# Patient Record
Sex: Male | Born: 1970 | Race: White | Hispanic: No | Marital: Married | Smoking: Former smoker
Health system: Southern US, Community
[De-identification: ages and names within clinical notes are randomized; demographics above are authoritative.]

## PROBLEM LIST (undated history)

## (undated) DIAGNOSIS — K648 Other hemorrhoids: Secondary | ICD-10-CM

## (undated) DIAGNOSIS — K602 Anal fissure, unspecified: Secondary | ICD-10-CM

## (undated) HISTORY — PX: TONSILLECTOMY: SUR1361

## (undated) HISTORY — DX: Anal fissure, unspecified: K60.2

## (undated) HISTORY — DX: Other hemorrhoids: K64.8

---

## 2005-01-15 ENCOUNTER — Encounter: Payer: Self-pay | Admitting: Internal Medicine

## 2005-01-19 ENCOUNTER — Ambulatory Visit: Payer: Self-pay | Admitting: Internal Medicine

## 2005-01-25 ENCOUNTER — Encounter (INDEPENDENT_AMBULATORY_CARE_PROVIDER_SITE_OTHER): Payer: Self-pay | Admitting: *Deleted

## 2005-01-25 ENCOUNTER — Ambulatory Visit: Payer: Self-pay | Admitting: Internal Medicine

## 2005-09-11 ENCOUNTER — Ambulatory Visit: Payer: Self-pay | Admitting: Internal Medicine

## 2005-09-14 ENCOUNTER — Encounter (INDEPENDENT_AMBULATORY_CARE_PROVIDER_SITE_OTHER): Payer: Self-pay | Admitting: *Deleted

## 2005-09-14 ENCOUNTER — Ambulatory Visit: Payer: Self-pay | Admitting: Internal Medicine

## 2005-09-18 ENCOUNTER — Ambulatory Visit (HOSPITAL_COMMUNITY): Admission: RE | Admit: 2005-09-18 | Discharge: 2005-09-18 | Payer: Self-pay | Admitting: Internal Medicine

## 2005-09-18 ENCOUNTER — Encounter: Payer: Self-pay | Admitting: Internal Medicine

## 2005-09-19 ENCOUNTER — Ambulatory Visit: Payer: Self-pay | Admitting: Internal Medicine

## 2005-09-28 ENCOUNTER — Ambulatory Visit: Payer: Self-pay | Admitting: Internal Medicine

## 2006-05-25 ENCOUNTER — Ambulatory Visit: Payer: Self-pay | Admitting: Internal Medicine

## 2008-04-05 ENCOUNTER — Emergency Department (HOSPITAL_COMMUNITY): Admission: EM | Admit: 2008-04-05 | Discharge: 2008-04-05 | Payer: Self-pay | Admitting: Emergency Medicine

## 2008-04-06 ENCOUNTER — Telehealth: Payer: Self-pay | Admitting: Internal Medicine

## 2008-04-08 ENCOUNTER — Telehealth: Payer: Self-pay | Admitting: Internal Medicine

## 2008-04-19 ENCOUNTER — Encounter: Payer: Self-pay | Admitting: Physician Assistant

## 2008-04-20 ENCOUNTER — Telehealth: Payer: Self-pay | Admitting: Internal Medicine

## 2008-04-21 ENCOUNTER — Ambulatory Visit: Payer: Self-pay | Admitting: Gastroenterology

## 2008-04-21 ENCOUNTER — Ambulatory Visit: Payer: Self-pay | Admitting: Internal Medicine

## 2008-04-21 DIAGNOSIS — K625 Hemorrhage of anus and rectum: Secondary | ICD-10-CM

## 2008-04-21 DIAGNOSIS — K602 Anal fissure, unspecified: Secondary | ICD-10-CM | POA: Insufficient documentation

## 2008-04-21 DIAGNOSIS — K648 Other hemorrhoids: Secondary | ICD-10-CM | POA: Insufficient documentation

## 2009-12-29 ENCOUNTER — Emergency Department (HOSPITAL_COMMUNITY): Admission: EM | Admit: 2009-12-29 | Discharge: 2009-12-29 | Payer: Self-pay | Admitting: Emergency Medicine

## 2009-12-29 ENCOUNTER — Encounter (INDEPENDENT_AMBULATORY_CARE_PROVIDER_SITE_OTHER): Payer: Self-pay | Admitting: *Deleted

## 2010-01-18 ENCOUNTER — Ambulatory Visit: Payer: Self-pay | Admitting: Gastroenterology

## 2010-01-18 ENCOUNTER — Telehealth: Payer: Self-pay | Admitting: Internal Medicine

## 2010-01-20 ENCOUNTER — Telehealth: Payer: Self-pay | Admitting: Physician Assistant

## 2010-01-24 ENCOUNTER — Ambulatory Visit: Payer: Self-pay | Admitting: Internal Medicine

## 2010-01-24 ENCOUNTER — Telehealth: Payer: Self-pay | Admitting: Internal Medicine

## 2010-01-24 ENCOUNTER — Telehealth (INDEPENDENT_AMBULATORY_CARE_PROVIDER_SITE_OTHER): Payer: Self-pay

## 2010-01-25 ENCOUNTER — Encounter: Payer: Self-pay | Admitting: Gastroenterology

## 2010-01-25 ENCOUNTER — Encounter: Payer: Self-pay | Admitting: Internal Medicine

## 2010-01-31 ENCOUNTER — Telehealth: Payer: Self-pay | Admitting: Internal Medicine

## 2010-02-01 ENCOUNTER — Encounter: Payer: Self-pay | Admitting: Internal Medicine

## 2010-02-11 ENCOUNTER — Ambulatory Visit: Payer: Self-pay | Admitting: Internal Medicine

## 2010-03-15 NOTE — Progress Notes (Signed)
Summary: Triage   Phone Note Call from Patient Call back at [phone]   Caller: Patient Call For: Dr. Leone Payor Reason for Call: Talk to Nurse Summary of Call: rectal bleeding and pain requesting sooner appt. than next avail. "the pain is unbearable" Initial call taken by: Karna Christmas,  January 18, 2010 8:18 AM  Follow-up for Phone Call        Patient c/o lots of pain and some rectal bleeding. States needs to be seen. Appointment made with Mike Gip, PA for today at 2pm. Patient concerned that he has no insurance, states he will bring money for todays visit. Follow-up by: Selinda Michaels RN,  January 18, 2010 9:16 AM

## 2010-03-15 NOTE — Assessment & Plan Note (Signed)
Summary: Rectal bleeding, pain/LRH    History of Present Illness Visit Type: Follow-up Visit Primary GI MD: Stan Head MD Primary Provider: Louanna Raw MD Requesting Provider: na Chief Complaint: Rectal pain, and BRB in stool after BMs. Pt question if anal fissure  History of Present Illness:   40 YO MALE KNOWN TO DR. Leone Payor WITH HX OF AN ANAL FISSURE IN 2010. HE UNDERWENT FLEX-SIGMOID AT THAT TIME, AND WAS TREATED WITH DILTIAZEM GEL WITH EVENTUAL HEALING. PT COMES IN TODAY AS  AN URGENT ADD ON WITH SEVERE RECTAL PAIN X  2WEEKS. HE WAS SEEN IN THE ER 11/16-HAD A CT ABD/PELVIS WHICH WAS NEGATIVE. HE WAS GIVEN ANALGESICS AND LIDOCAINE OINTMENT TO USE. PT SAYS HE DOES NOT FEEL ANY BETTER, AND IS VERY UNCOMFORTABLE,HAVIG A HARD TIME WORKING. HE IS HAVING REGULAR BM'S WITH DULCOLAX  BALANCE,  BUT HAS INTENSE PAIN  WITH BM'S. NO SIGNIICANT BLEEDING. HE HAS NOT BEEN ABLE TO INSERT THE LIDOCAINE INTO THE RECTUM DUE TO PAIN. HE DESCRIBES A CONSTANT THROBBING PAIN,POSTERIORLY AND INTERNALLY.   GI Review of Systems    Reports loss of appetite.      Denies abdominal pain, acid reflux, belching, bloating, chest pain, dysphagia with liquids, dysphagia with solids, heartburn, nausea, vomiting, vomiting blood, weight loss, and  weight gain.      Reports anal fissure,constipation, rectal bleeding, and  rectal pain.     Denies black tarry stools, change in bowel habit, diarrhea, diverticulosis, fecal incontinence, heme positive stool, hemorrhoids, irritable bowel syndrome, jaundice, light color stool, and  liver problems.    Current Medications (verified): 1)  Dulcolax Balance  Powd (Polyethylene Glycol 3350) .... One Glass Daily  Allergies (verified): No Known Drug Allergies  Past History:  Past Medical History:  HEMORRHOIDS, INTERNAL (ICD-455.0) ANAL FISSURE (ICD-565.0)  Past Surgical History: Unremarkable  Family History: Reviewed history from 04/21/2008 and no changes required. Family  History of Diabetes: mother No FH of Colon Cancer:  Social History: Reviewed history from 04/21/2008 and no changes required. Occupation: welder Patient currently smokes. 1 PPD Alcohol Use - no Daily Caffeine Use  2 per day SEPARATED  Review of Systems       The patient complains of fever.  The patient denies allergy/sinus, anemia, anxiety-new, arthritis/joint pain, back pain, blood in urine, breast changes/lumps, change in vision, confusion, cough, coughing up blood, depression-new, fainting, fatigue, headaches-new, hearing problems, heart murmur, heart rhythm changes, itching, menstrual pain, muscle pains/cramps, night sweats, nosebleeds, pregnancy symptoms, shortness of breath, skin rash, sleeping problems, sore throat, swelling of feet/legs, swollen lymph glands, thirst - excessive , urination - excessive , urination changes/pain, urine leakage, vision changes, and voice change.         SEE HPI  Vital Signs:  Patient profile:   40 year old male Height:      74 inches Weight:      172 pounds BMI:     22.16 BSA:     2.04 Temp:     97.6 degrees F oral Pulse rate:   96 / minute Pulse rhythm:   regular BP sitting:   124 / 76  (left arm) Cuff size:   regular  Vitals Entered By: Ok Anis CMA (January 18, 2010 2:04 PM)  Physical Exam  General:  Well developed, well nourished, no acute distress., UNCPMFORTABLE, UNABLE TO SIT STILL Head:  Normocephalic and atraumatic. Eyes:  PERRLA, no icterus. Lungs:  Clear throughout to auscultation. Heart:  Regular rate and rhythm; no murmurs, rubs,  or bruits. Abdomen:  SOFT, NONTENDER, NO MASS OR HSM,BS+ Rectal:  UNABLE TO DO DIGITAL EXAM DUE TO DISCOMFORT BUT ON EXTERNAL EXAM HE HAS A POSTERIOIR SENTINEL PILE, AND A FISSURE VISIBLE AT THE 12 OCLOCK POSITION Neurologic:  Alert and  oriented x4;  grossly normal neurologically. Psych:  Alert and cooperative. Normal mood and affect.anxious.     Impression & Recommendations:  Problem #  1:  ANAL FISSURE (ICD-565.0) Assessment Deteriorated 40 YO MALE WITH A RECURRENT POSTERIOR ANAL FISSURE.  CONTINUE DULCOLAX BALANCE DAILY EXPLAINED HOW TO INSERT LIDOCAINE INTERNALLY, AND ADVISED USE 2-3 X DAILY START DILTIZEM GEL 2% AND ALTERNATE WITH LIDOCAINE ,USING EACH 2-3 X DAILY SITZ BATHS TWICE DAILY VICODEN 7.5/500  Q 4-6 HOURS AS NEEDED FOR PAIN FOLLOW UP WITH DR. Leone Payor IN 2-3 WEEKS.  Patient Instructions: 1)  Take Sitz baths 2-3 times daily. 2)  We are faxing prescriptions tp [facility] for Diltiazem Gel and Hydrocodone 7.5/500 mg .   3)  Use the Lidocaine 3-4 times daily and alternate with Diltiazem Gel.  4)  Copy sent to : Louanna Raw, MD 5)  The medication list was reviewed and reconciled.  All changed / newly prescribed medications were explained.  A complete medication list was provided to the patient / caregiver. Prescriptions: DILTIAZEN GEL 2 % Use 2-3 times daily insert with a q tip rectally for pain  #15 grams x 1   Entered by:   Lowry Ram NCMA   Authorized by:   Sammuel Cooper PA-c   Signed by:   Lowry Ram NCMA on 01/18/2010   Method used:   Printed then faxed to ...       [facility]* (retail)       [address]       [place], [place]  [id number]       Ph: [phone]       Fax: [phone]   RxID:   [id number] HYDROCODONE-ACETAMINOPHEN 7.5-500 MG TABS (HYDROCODONE-ACETAMINOPHEN) Take 1 tab every 6 hours as needed for pain  #40 x 1   Entered by:   Lowry Ram NCMA   Authorized by:   Sammuel Cooper PA-c   Signed by:   Lowry Ram NCMA on 01/18/2010   Method used:   Printed then faxed to ...       [id number] (retail)       [address]       [place], [place]  [address]       Ph: [phone] or [phone]       Fax: [phone]   RxID:   [id number]

## 2010-03-15 NOTE — Progress Notes (Signed)
Summary: Progress report- appt w/ Leone Payor   Phone Note Outgoing Call   Call placed by: Joselyn Glassman,  January 20, 2010 3:31 PM Call placed to: Patient Summary of Call: Advised pt of his appointment we scheduled with Dr. Leone Payor for 02-11-2010 at 9:30 AM.  I asked how he was doing and he said that he is some improved.  He is still in some pain but it has only been 2 days since he saw the PA.  He is using the cream and the pain medication.  He asked how much he would have to pay, he has no ins. I asked if he filled out the [facility] paperwork for billing and he said he is doing that now and will try to get that in before the appt on the 30th.  I told him I will call him back if he is required to pay anything on the 30th. Initial call taken by: Joselyn Glassman,  January 20, 2010 3:34 PM     Appended Document: Progress report- appt w/ Regan Lemming pt back and lm for him.   I left a message that when he comes in for the visit with Dr. Leone Payor that he can asked to be billed for that visit.  When his paperwork is processed with [facility] he may be able to include the 02-11-2010 office visit to be covered under his financial level he qualifies for.

## 2010-03-17 NOTE — Letter (Signed)
Summary: [facility] Office Note  [facility] Office Note   Imported By: Lamona Curl CMA (AAMA) 01/31/2010 14:35:38  _____________________________________________________________________  External Attachment:    Type:   Image     Comment:   External Document

## 2010-03-17 NOTE — Progress Notes (Signed)
  Spoke with patient, he was set up to see the surgeon, Rosenbower, for 01/25/10 @ 2:45pm. [facility] [place], [facility] will call [facility] and request the refill [facility] Vicodin [facility] [place]. Patient is to continue using the Diltiazem gel, Lidocaine gel, and Vicodin. If patient feels that he cannot wait until appointment with [facility] he was advised he could go to the ER. Patient verbalized understanding. Referral made to [facility] per Mike Gip, PA.

## 2010-03-17 NOTE — Progress Notes (Signed)
Summary: Med refill   Phone Note Call from Patient Call back at Home Phone [phone]   Caller: Patient Call For: Dr. Leone Payor Reason for Call: Refill Medication Summary of Call: Needs a refill on his Hydrocodone and Diltiazem Initial call taken by: Karna Christmas,  January 31, 2010 1:14 PM  Follow-up for Phone Call        Patient called to request refills on both hydrocodone and diltazem cream. I explained to him that he should still have an additional refill on both medications that he can call the pharmacy for. Patient states that he only got #15 grams with 0 rf of the diltazem cream and has already used the #40 hydrocodone with the refill. Explained to patient that the hydrocodone prescription was probably intended to last longer than it has and he should only be taking the prescription AS NEEDED. Patient states he took exactly as prescribed, every 6 hours. I have called pharmacy and asked them to give him a refill on Diltazem (they did not provide him with an additional refill that we okayed) Also, patient states that he is feeling better and is now having soft bowel movements without any pain. However, he has developed a small nodule (pea sized) at the area of the fissure which Dr Abbey Chatters attributed to scar tissue. Patient does think that the area has increased to the size of a bean and that area is painful to the touch. He has an appointment with [place] on 02/11/10. I have entered Dr Maris Berger note into EMR for your review. Follow-up by: Lamona Curl CMA Duncan Dull),  January 31, 2010 3:22 PM  Additional Follow-up for Phone Call Additional follow up Details #1::        Patient has been advised that he should contact [facility] about the nodular area at the fissure if it is now getting larger and is tender to the touch. Dr Arne Cleveland office can give him more pain medication if they find it appropriate. Patient verbalizes understanding. Additional Follow-up by: Lamona Curl CMA  Duncan Dull),  January 31, 2010 3:24 PM    Additional Follow-up for Phone Call Additional follow up Details #2::    Agree with above plan. Follow-up by: Meryl Dare MD Clementeen Graham,  January 31, 2010 3:37 PM

## 2010-03-17 NOTE — Letter (Signed)
Summary: [facility]   Imported By: Sherian Rein 02/22/2010 14:21:27  _____________________________________________________________________  External Attachment:    Type:   Image     Comment:   External Document

## 2010-03-17 NOTE — Letter (Signed)
Summary: [facility]   Imported By: Lester Colusa 02/17/2010 10:05:30  _____________________________________________________________________  External Attachment:    Type:   Image     Comment:   External Document

## 2010-03-17 NOTE — Progress Notes (Signed)
Summary: Triage   Phone Note Call from Patient Call back at Home Phone [phone]   Caller: Patient Call For: Dr. Leone Payor Reason for Call: Talk to Nurse Summary of Call: requesting sooner appt. than 02-11-10 b/c he has been in severe pain all weekend Initial call taken by: Karna Christmas,  January 24, 2010 8:20 AM  Follow-up for Phone Call        Patient given appointment with Mike Gip, PA today at 3:30pm. Follow-up by: Selinda Michaels RN,  January 24, 2010 8:52 AM

## 2010-04-26 LAB — CBC
Hemoglobin: 15.2 g/dL (ref 13.0–17.0)
MCH: 30.1 pg (ref 26.0–34.0)
MCHC: 34.7 g/dL (ref 30.0–36.0)
MCV: 86.9 fL (ref 78.0–100.0)

## 2010-04-26 LAB — DIFFERENTIAL
Basophils Relative: 0 % (ref 0–1)
Eosinophils Absolute: 0.1 10*3/uL (ref 0.0–0.7)
Eosinophils Relative: 1 % (ref 0–5)
Monocytes Relative: 5 % (ref 3–12)
Neutrophils Relative %: 76 % (ref 43–77)

## 2010-04-26 LAB — POCT I-STAT, CHEM 8
Calcium, Ion: 1.14 mmol/L (ref 1.12–1.32)
Glucose, Bld: 98 mg/dL (ref 70–99)
HCT: 48 % (ref 39.0–52.0)
Hemoglobin: 16.3 g/dL (ref 13.0–17.0)
Potassium: 4.4 mEq/L (ref 3.5–5.1)
TCO2: 28 mmol/L — ABNORMAL HIGH (ref 23.0–27.0)

## 2010-05-31 LAB — DIFFERENTIAL
Basophils Absolute: 0.1 10*3/uL (ref 0.0–0.1)
Lymphocytes Relative: 30 % (ref 12–46)
Lymphs Abs: 2.5 10*3/uL (ref 0.7–4.0)
Neutrophils Relative %: 58 % (ref 43–77)

## 2010-05-31 LAB — BASIC METABOLIC PANEL
BUN: 11 mg/dL (ref 6–23)
Calcium: 9.2 mg/dL (ref 8.4–10.5)
Creatinine, Ser: 0.95 mg/dL (ref 0.4–1.5)
GFR calc non Af Amer: >60 mL/min (ref >60)
Potassium: 3.7 mEq/L (ref 3.5–5.1)

## 2010-05-31 LAB — CBC
Platelets: 258 10*3/uL (ref 150–400)
RDW: 13.2 % (ref 11.5–15.5)
WBC: 8.4 10*3/uL (ref 4.0–10.5)

## 2010-06-28 NOTE — Letter (Signed)
July 16, 2006    Luvenia Redden  [address]  [place], [place] [address]   RE:  PRINTICE, HELLMER  MRN:  [id number]  /  DOB:  May 09, 1970   Dear Mr. Puls:   I hope you are feeling better these days.  I know you called the office  the other day asking for a refill of pain medications.  Because you were  having worsening symptoms, I thought you should be evaluated by a  physician or other health care Careena Degraffenreid prior to receiving more narcotic  pain medication.  I did and still think that was the best medical  practice.   I am sorry that you are frustrated we were not able to call something in  over the phone but that is how I must practice medicine to protect my  patients.   I do hope we can continue to work together for your health care needs.    Sincerely,      Iva Boop, MD,FACG  Electronically Signed    CEG/MedQ  DD: 07/15/2006  DT: 07/15/2006  Job #: [id number]

## 2010-07-01 NOTE — Assessment & Plan Note (Signed)
[name]                         GASTROENTEROLOGY OFFICE NOTE   NAME:Odom, Nathan EMBLETON                       MRN:          [id number]  DATE:05/25/2006                            DOB:          05/18/1970    CHIEF. COMPLAINT:  Rectal pain, anal fissure.   HISTORY:  I have not seen [name] in about a year. He had a diagnosis of  irritable bowel syndrome. He had erythema and edema in the rectum and he  was tender and he had a very strong irritable bowel syndrome response. I  put him on some Canasa suppositories. He was also treated with some  Zoloft and benzodiazepines for anxiety and significant stress. The  biopsies from his rectum  suggested hyperplastic polyp changes.   In the interim he had done ok for a while using his Canasa suppositories  etc. 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  Says that is going well, he now has insurance again and he has been  seeing Dr. Earlene Plater in the urgent care clinic on [address]. He has tried  various creams and agents for his anus and the pain in the rectum. It  gets irritated and he has seen some bright red blood on the tissue paper  at times. Dr. Earlene Plater has him on a stool softener and he takes that daily  with good results, i.e. no significant diarrhea or problems with  constipation. He is not really having any abdominal pain. His family  life is good. The situational stressors seem much improved. He has a lot  of itching in the anal area as well. Sometimes he will go between hard  and soft stools but again he thinks the stool softener is better. He has  tried Analpram, Beta-Val cream (question yeast infection), mupirocin  ointment, and Canasa suppositories. He has not been able to get relief.  He notices fairly intense pain the day after he drives either to  [place] or when he comes home, he is going up on Sundays and  returning  on Thursdays. He is soaking in the tub and that seems to help.   PAST MEDICAL HISTORY:  Reviewed and unchanged from previous notes.   PHYSICAL EXAMINATION:  Weight 182 pounds, pulse 64, blood pressure  122/80. Well-developed, well-nourished, no acute distress.  The anal area shows some mild erythema. He is tender around the anus. He  says most of the pain is posterior.  RECTAL EXAM: With my fifth digit shows an exquisitely tender posterior  rectal area. I do not see any sentinel piles.   ASSESSMENT:  Rectal pain which certainly could be due to an anal  fissure. I sounds most like that. He has responded partially to other  topical agents. He has this edematous look to his rectum before of  unclear etiology (see above) at colonoscopy.   PLAN:  1. Trial of diltiazem gel 2% b.i.d. to t.i.d.  2. Vicodin 5/500 #20 given to the patient to take for severe pain.  3. I want to see him back in 6 months for reassessment. He might need      a sigmoidoscopy to look at the anal and rectal area again versus      surgical referal depending upon the response. Hopefully he will get      a good response to this topical agent with diltiazem.   Anal fissure handout given to the patient, he is encouraged to continue  sitz baths. He is to call sooner if things worsen.     Iva Boop, MD,FACG  Electronically Signed    CEG/MedQ  DD: 05/25/2006  DT: 05/25/2006  Job #: [id number]   cc:   Louanna Raw

## 2010-07-01 NOTE — Assessment & Plan Note (Signed)
 HEALTHCARE                           GASTROENTEROLOGY OFFICE NOTE   NAME:[name], [name]                       MRN:          [id number]  DATE:09/28/2005                            DOB:          [birth year]    CHIEF COMPLAINT:  Followup of irritable bowel syndrome.   ASSESSMENT:  1. Irritable bowel syndrome, much better.  2. Unfortunately, there are worsening situational psychostressors.  He is      losing his job.  He has financial distress and marital distress as      well.   PLAN:  1. Start sertraline 50 mg daily for 4 days, and then 100 mg daily.  One-      month prescription and 2 refills given.  2. Follow up as needed at this point.  He is not suicidal.  He is not      really depressed, but I think there is significant underlying anxiety      contributing to his irritable bowel syndrome.  He knows to call back if      things are not improving.  3. I have not given him any more lorazepam, advising him that we want to      try to switch him over to an SSRI after than a regular benzodiazepine.  4. He is continuing on counseling with his pastor and his wife regarding      the marital issues.  I have offered other counseling, and he does not      wish to pursue a psychologist at this time.                                   Iva Boop, MD, [facility]   CEG/MedQ  DD:  09/28/2005  DT:  09/29/2005  Job #:  [id number]

## 2010-07-01 NOTE — Assessment & Plan Note (Signed)
[facility]                           GASTROENTEROLOGY OFFICE NOTE   NAME:[name], [name]                       MRN:          [id number]  DATE:09/11/2005                            DOB:          July 17, 1970    REQUESTING PHYSICIAN:  Louanna Raw   REASON FOR CONSULTATION:  Diarrhea, abdominal pain, abnormal intestines on  CT scan.   ASSESSMENT:  Several week history of worsening diarrhea, fairly severe with  rather diffuse abdominal pain.  He has thickening of the ileum and colon  down into the rectum on CT scan of September 06, 2005.  I have reviewed the  report and the films.  This is suspicious for irritable bowel disease like  Crohn's disease.  Infection is possible, but he has had no benefit on Cipro  and Flagyl at this time.  He has had progressive worsening abdominal pain  over the past couple of months, and that is another reason I favor irritable  bowel disease.   RECOMMENDATIONS/PLAN:  1.  Proceed to colonoscopy in the next couple of days.  2.  Lomotil 1-2 q.6 h p.r.n.  3.  Continue current medications.  4.  Promethazine 25 mg q.8 h p.r.n. as prescribed as well.   HISTORY OF PRESENT ILLNESS:  This is a 40 year old white male who I know  from previous endoscopy and consultation.  On January 25, 2005, he had a  small ulcer at the cardia thought to be traumatic or Mallory-Weiss tear.  I  though he was having some musculoskeletal pain at that time.  His biopsies  showed some gastritis, but otherwise, were unrevealing.  He has been  describing problems with rectal bleeding and diarrhea as noted above, and  over the past three weeks he has been fairly miserable with nocturnal stools  and worsening abdominal pain and pressure.  It feels like he has to defecate  all the time.  CT findings as above.  This is a try at imaging study.  He  has scattered enlarged mesenteric nodes as well.  He denies fever at this  point.  He started on Cipro and  Flagyl a few days ago, and that really has  not helped.   MEDICATIONS:  1.  Protonix 40 mg daily.  2.  Dicyclomine 20 mg t.i.d.  3.  Rozerem 8 mg daily.  4.  Hydrocodone p.r.n.   ALLERGIES:  No known drug allergies.   He does not have any contacts that are sick.  There has been no recent  antibiotics that I can tell of.  No travel, etc.   PAST MEDICAL HISTORY:  1.  As above.  2.  Anxiety and depression.  3.  Insomnia.   FAMILY/SOCIAL HISTORY:  Reviewed and unchanged from December 2006 note.  He  is a Corporate investment banker.  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  He is having difficulty working.   REVIEW OF SYSTEMS:  Insomnia is worse at this point.  Denies urinary  difficulty.  He is having some right shoulder pain after he had the CT scan.  All other systems appear  negative other than mentioned above at this time.   PHYSICAL EXAMINATION:  GENERAL APPEARANCE:  Pleasant, well-developed, mildly  ill white man.  VITAL SIGNS:  Blood pressure 124/80, pulse 64 and regular, respirations 18.  HEENT:  Eyes:  Anicteric.  NECK:  Supple.  No mass.  CHEST:  Clear.  EXTREMITIES:  No edema.  ABDOMEN:  He is diffusely tender, more so in the right lower quadrant than  anywhere.  There is no real guarding or peritoneal signs.  Bowel sounds are  present.  They are not increased or diminished.  SKIN:  Without acute rash.  He has had a couple papular eruptions on the  lower extremities, he though have been poison ivy.  PSYCHIATRIC:  He is somewhat anxious with this.   I appreciate the opportunity to care for this patient.  Note that we will  ask for the records of labs from Dr. Earlene Plater' office.   ADDENDUM:  He said that his Rozerem was not working for his insomnia.  I did  prescribe #30 Ambien 10 mg to use p.r.n.                                   Iva Boop, MD, [facility]   CEG/MedQ  DD:  09/11/2005  DT:  09/12/2005  Job #:  [id number]   cc:   Louanna Raw

## 2010-07-01 NOTE — Assessment & Plan Note (Signed)
[facility]                           GASTROENTEROLOGY OFFICE NOTE   NAME:[name], [name]                       MRN:          [id number]  DATE:09/19/2005                            DOB:          22-May-1970    CHIEF COMPLAINT:  Follow up of diarrhea and abdominal pain.   See my Medical History and Physical form for full details of this visit.   ASSESSMENT:  1.  Diarrhea, abdominal pain.  This has persisted, though is much better.      His colonoscopy showed some patchy erythema and edema in the rectum,      otherwise normal looking colonoscopy with biopsy showing underlying      lymphoid aggregates but no inflammatory change; the biopsies in the      rectum demonstrated some hyperplastic polypoid-like changes, though      there really were no hyperplastic polyps.  I started him on some Canasa      suppositories which had helped a little bit.  He remains on dicyclomine      and is really somewhat better.  2.  Significant psychosocial stressors with marital discord and it sounds      like financial stressors as well.   RECOMMENDATIONS AND PLAN:  1.  As his small bowel series was unremarkable, though the ileum was not      seen well due to a posteriorly positioned cecum and ileocecal valve, it      is possible he could have Crohn's disease, though his symptoms,      particularly the left sided abdominal pain, do not go along with that      right now.  He is better overall.  He also may have had an infectious      etiology of his initial presentation and exacerbation related to      possible underlying IBS.  Thus, a capsule endoscopy is considered but we      will hold on that for the next week.  2.  Add Lorazepam 1 mg at bedtime and he can use for severe anxiety during      the day, #30 with no refills.  He is instructed not to use this while      using tools at work.  3.  I will reassess him in 1 week.  If he is persistently bothered, I think  we will do a capsule endoscopy of the small intestine.  4.  I discussed the potential need and help of counseling with the patient      and his wife, given their issues, and they will start with their pastor.                                   Iva Boop, MD, [facility]   CEG/MedQ  DD:  09/19/2005  DT:  09/19/2005  Job #:  [id number]   cc:   Louanna Raw

## 2011-11-17 IMAGING — CT CT ABD-PELV W/ CM
1 series · 16 of 32 positions shown, 20 images · IV contrast (omnipaque)
Comparison: None

CLINICAL DATA: Rectal pain, rectal bleeding.

CT ABDOMEN AND PELVIS WITH CONTRAST
TECHNIQUE: Multidetector CT imaging of the abdomen and pelvis was
performed following the standard protocol during bolus
administration of intravenous contrast.
Contrast: 100 ml Omnipaque 300 IV.

[Series 2: rtn ap with st · axial · 0.68mm/px · z∈[-425,+25]mm · 16 of 101 slices shown, 20 images]
[im 7/101  soft-tissue]
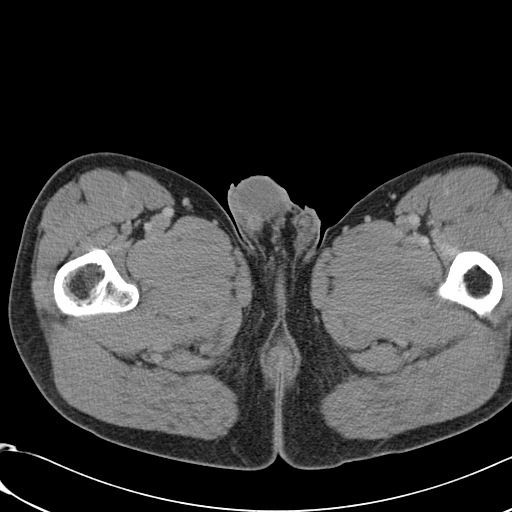
[im 7/101  bone]
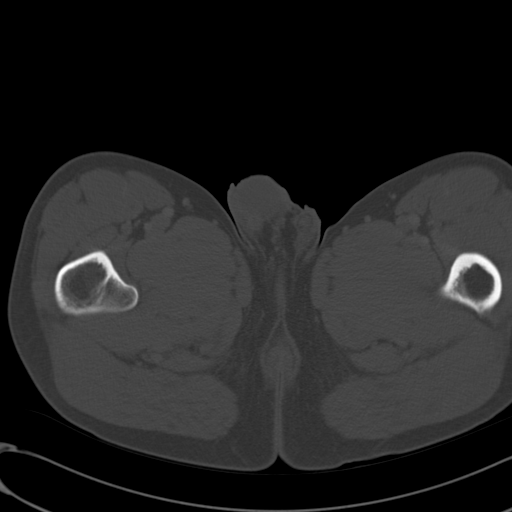
[im 13/101  soft-tissue]
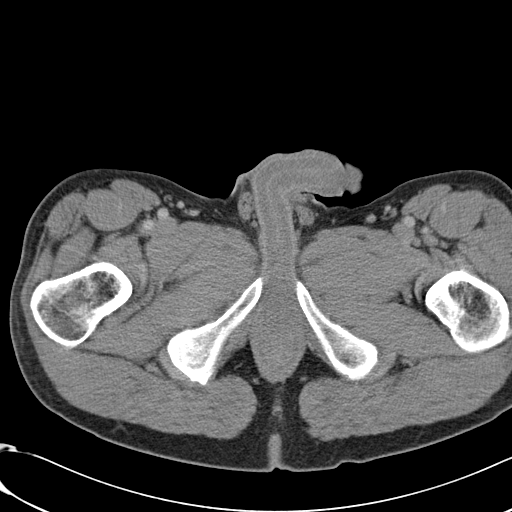
[im 20/101  soft-tissue]
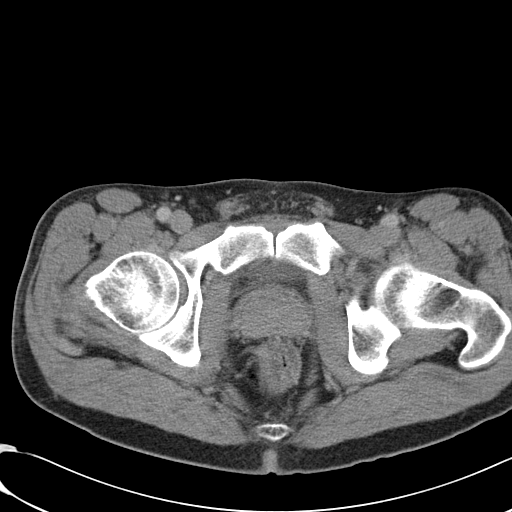
[im 26/101  soft-tissue]
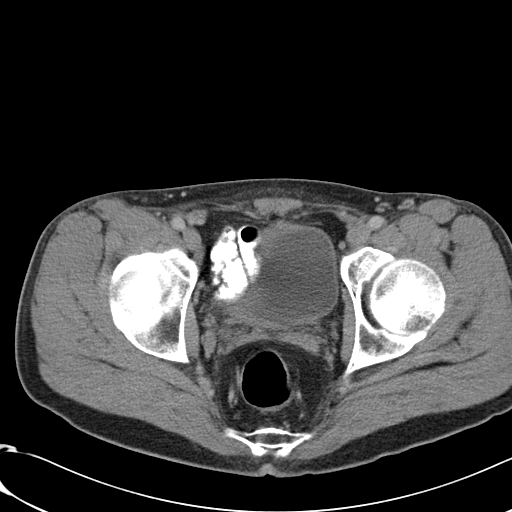
[im 33/101  soft-tissue]
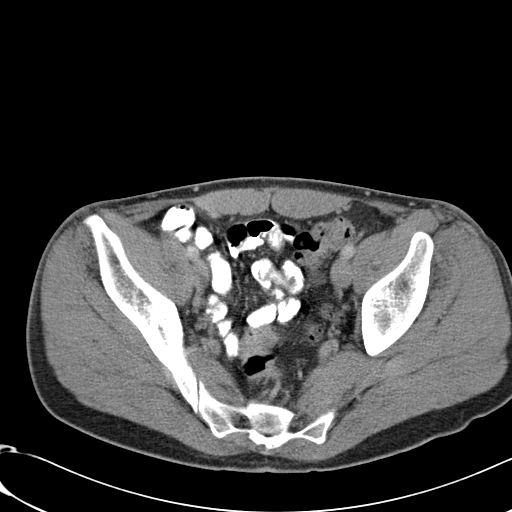
[im 39/101  soft-tissue]
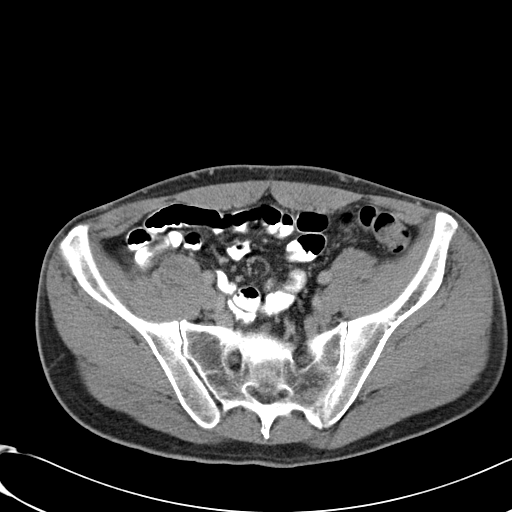
[im 46/101  soft-tissue]
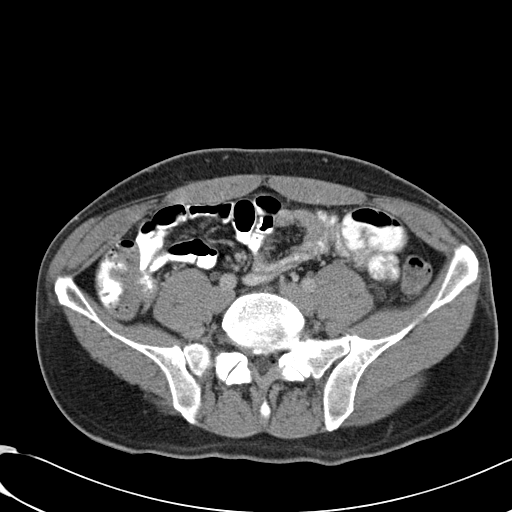
[im 55/101  soft-tissue]
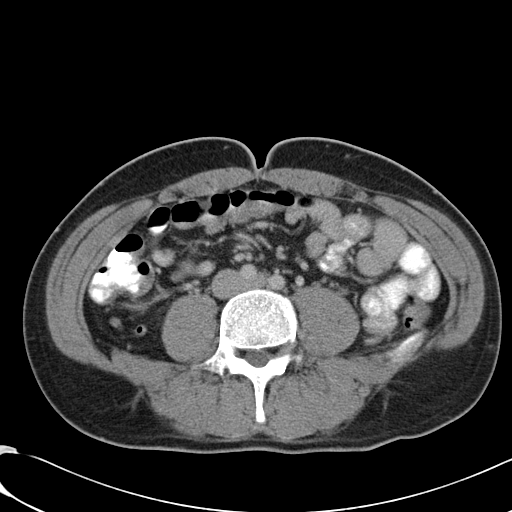
[im 62/101  soft-tissue]
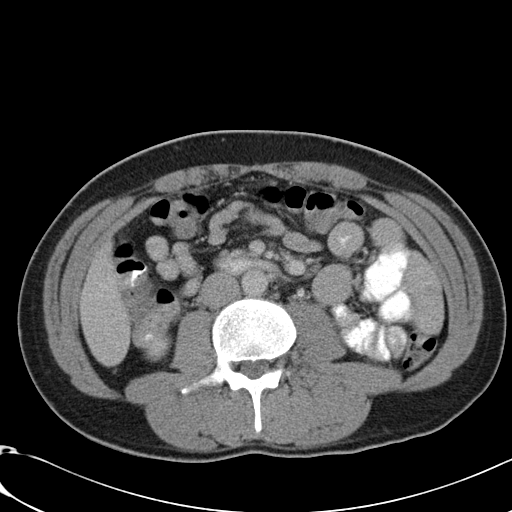
[im 62/101  bone]
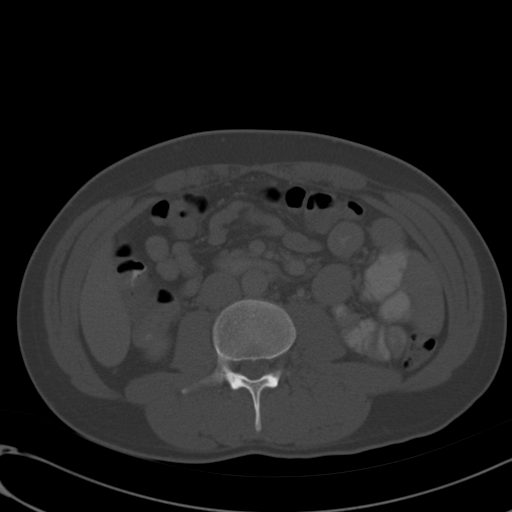
[im 68/101  soft-tissue]
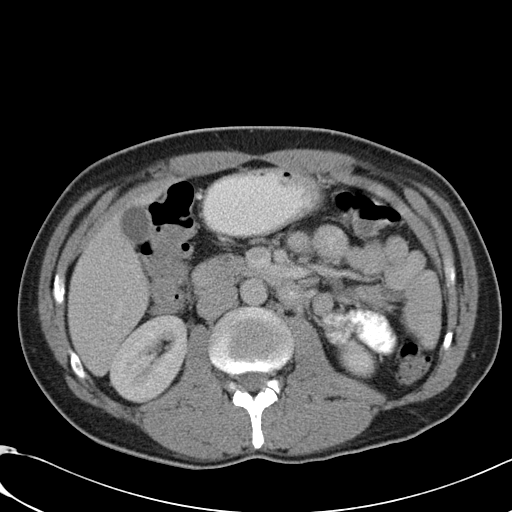
[im 75/101  soft-tissue]
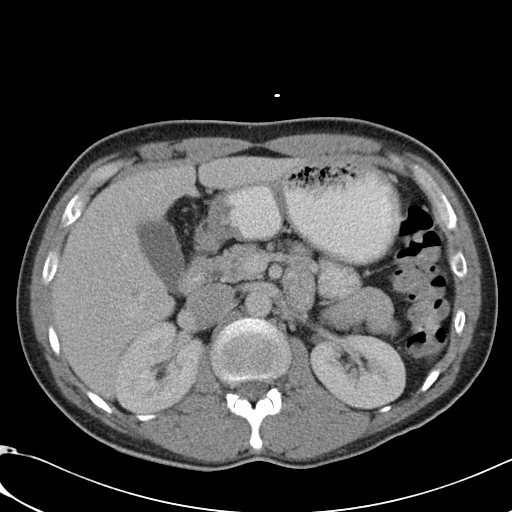
[im 81/101  soft-tissue]
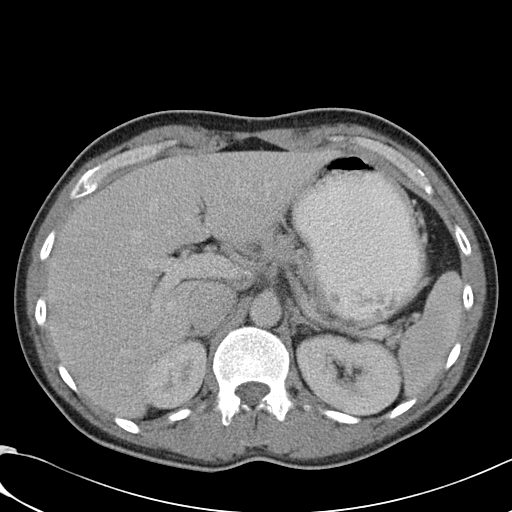
[im 88/101  soft-tissue]
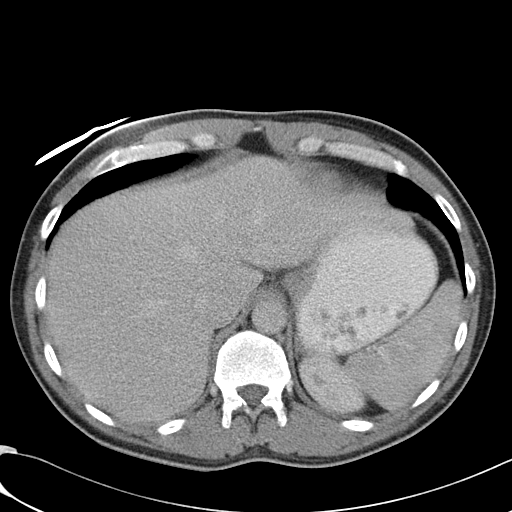
[im 88/101  lung]
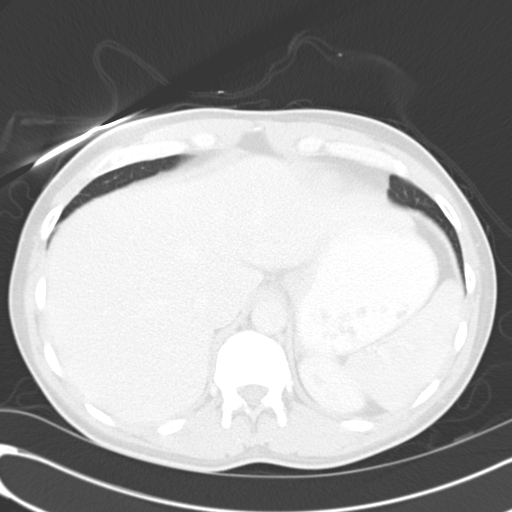
[im 91/101  lung]
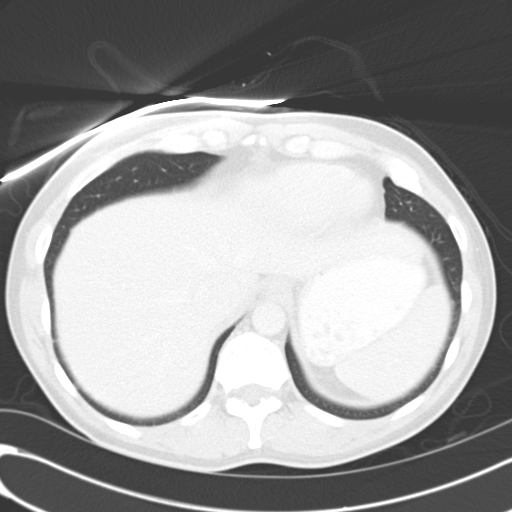
[im 94/101  soft-tissue]
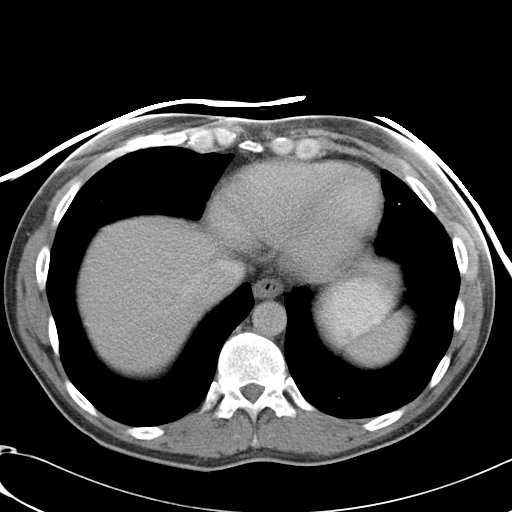
[im 94/101  lung]
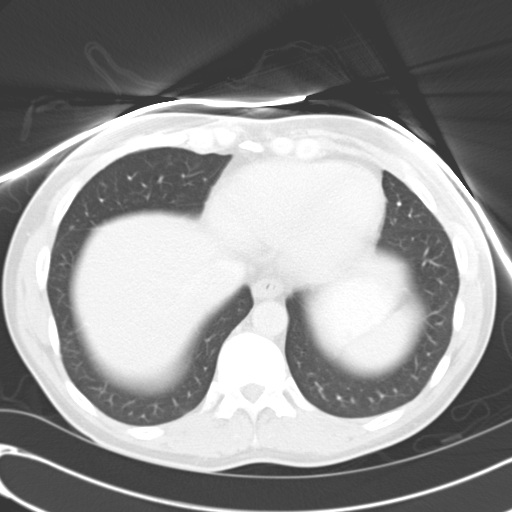
[im 97/101  lung]
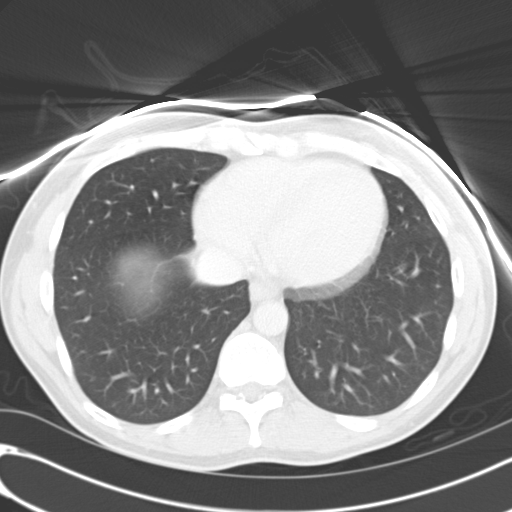

[16 of 32 positions shown; findings below may reference images not displayed]

FINDINGS: Lung bases are clear.  No effusions.  Heart is normal
size.

Appendix is visualized and is normal. Bowel grossly unremarkable.
No free fluid, free air, or adenopathy.  Scattered small mesenteric
lymph nodes, none pathologically enlarged.

Liver, gallbladder, spleen, pancreas, adrenals kidneys and urinary
bladder are unremarkable.

No acute bony abnormality.
IMPRESSION: No acute findings in the abdomen or pelvis.

## 2012-04-04 ENCOUNTER — Telehealth: Payer: Self-pay | Admitting: Internal Medicine

## 2012-04-04 NOTE — Telephone Encounter (Signed)
Patient reports that he has a history of ulcer. Patient developed abdominal pain, hematemesis, and black stools and was seen in the ER in [place] yesterday.  He brought the records back.  He was advised to be seen here asap.  He will come in and see Doug Sou, PA tomorrow at 1:30.  He will bring $180 to the visit and the records from [place].  He is not symptomatic at this time, but knows to go to the ER if he develops new symptoms.

## 2012-04-05 ENCOUNTER — Encounter: Payer: Self-pay | Admitting: *Deleted

## 2012-04-05 ENCOUNTER — Ambulatory Visit: Payer: Self-pay | Admitting: Gastroenterology

## 2012-08-28 ENCOUNTER — Emergency Department (HOSPITAL_COMMUNITY): Payer: Self-pay

## 2012-08-28 ENCOUNTER — Emergency Department (HOSPITAL_COMMUNITY)
Admission: EM | Admit: 2012-08-28 | Discharge: 2012-08-28 | Disposition: A | Discharge status: Home / Self Care | Payer: Self-pay | Attending: Emergency Medicine | Admitting: Emergency Medicine

## 2012-08-28 ENCOUNTER — Encounter (HOSPITAL_COMMUNITY): Payer: Self-pay | Admitting: *Deleted

## 2012-08-28 DIAGNOSIS — X088XXA Exposure to other specified smoke, fire and flames, initial encounter: Secondary | ICD-10-CM | POA: Insufficient documentation

## 2012-08-28 DIAGNOSIS — T22019A Burn of unspecified degree of unspecified forearm, initial encounter: Secondary | ICD-10-CM | POA: Insufficient documentation

## 2012-08-28 DIAGNOSIS — Z8679 Personal history of other diseases of the circulatory system: Secondary | ICD-10-CM | POA: Insufficient documentation

## 2012-08-28 DIAGNOSIS — F17200 Nicotine dependence, unspecified, uncomplicated: Secondary | ICD-10-CM | POA: Insufficient documentation

## 2012-08-28 DIAGNOSIS — Z8719 Personal history of other diseases of the digestive system: Secondary | ICD-10-CM | POA: Insufficient documentation

## 2012-08-28 DIAGNOSIS — Y9389 Activity, other specified: Secondary | ICD-10-CM | POA: Insufficient documentation

## 2012-08-28 DIAGNOSIS — R000 Tachycardia, unspecified: Secondary | ICD-10-CM | POA: Insufficient documentation

## 2012-08-28 DIAGNOSIS — IMO0002 Reserved for concepts with insufficient information to code with codable children: Secondary | ICD-10-CM | POA: Insufficient documentation

## 2012-08-28 DIAGNOSIS — L539 Erythematous condition, unspecified: Secondary | ICD-10-CM | POA: Insufficient documentation

## 2012-08-28 DIAGNOSIS — Y929 Unspecified place or not applicable: Secondary | ICD-10-CM | POA: Insufficient documentation

## 2012-08-28 MED ORDER — CLINDAMYCIN HCL 300 MG PO CAPS
600.0000 mg | ORAL_CAPSULE | ORAL | Status: AC
  Administered 2012-08-28: 600 mg via ORAL
  Filled 2012-08-28: qty 2

## 2012-08-28 MED ORDER — HYDROCODONE-ACETAMINOPHEN 5-325 MG PO TABS: 1.0000 | ORAL_TABLET | ORAL | Status: DC | PRN

## 2012-08-28 MED ORDER — OXYCODONE-ACETAMINOPHEN 5-325 MG PO TABS
2.0000 | ORAL_TABLET | Freq: Once | ORAL | Status: AC
  Administered 2012-08-28: 2 via ORAL
  Filled 2012-08-28: qty 2

## 2012-08-28 MED ORDER — CLINDAMYCIN HCL 150 MG PO CAPS: 450.0000 mg | ORAL_CAPSULE | Freq: Three times a day (TID) | ORAL | Status: DC

## 2012-08-28 NOTE — ED Notes (Signed)
Reports injury to left wrist on Saturday, now has abscess, swelling to left posterior wrist, swelling and redness is spreading into his hand into forearm.
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
## 2012-08-28 NOTE — ED Provider Notes (Signed)
Medical screening examination/treatment/procedure(s) were performed by non-physician practitioner and as supervising physician I was immediately available for consultation/collaboration.   Lanise Mergen Ann Saoirse Legere, MD 08/28/12 2359 

## 2017-08-22 ENCOUNTER — Encounter: Payer: Self-pay | Admitting: Emergency Medicine

## 2017-08-22 ENCOUNTER — Emergency Department
Admission: EM | Admit: 2017-08-22 | Discharge: 2017-08-22 | Disposition: A | Discharge status: Home / Self Care | Payer: Self-pay | Attending: Emergency Medicine | Admitting: Emergency Medicine

## 2017-08-22 ENCOUNTER — Emergency Department: Payer: Self-pay

## 2017-08-22 ENCOUNTER — Other Ambulatory Visit: Payer: Self-pay

## 2017-08-22 DIAGNOSIS — S61211A Laceration without foreign body of left index finger without damage to nail, initial encounter: Secondary | ICD-10-CM | POA: Insufficient documentation

## 2017-08-22 DIAGNOSIS — Y9389 Activity, other specified: Secondary | ICD-10-CM | POA: Insufficient documentation

## 2017-08-22 DIAGNOSIS — Y998 Other external cause status: Secondary | ICD-10-CM | POA: Insufficient documentation

## 2017-08-22 DIAGNOSIS — W278XXA Contact with other nonpowered hand tool, initial encounter: Secondary | ICD-10-CM | POA: Insufficient documentation

## 2017-08-22 DIAGNOSIS — Y92008 Other place in unspecified non-institutional (private) residence as the place of occurrence of the external cause: Secondary | ICD-10-CM | POA: Insufficient documentation

## 2017-08-22 DIAGNOSIS — F17210 Nicotine dependence, cigarettes, uncomplicated: Secondary | ICD-10-CM | POA: Insufficient documentation

## 2017-08-22 MED ORDER — LIDOCAINE HCL (PF) 1 % IJ SOLN
5.0000 mL | Freq: Once | INTRAMUSCULAR | Status: AC
  Administered 2017-08-22: 5 mL
  Filled 2017-08-22: qty 5

## 2017-08-22 NOTE — ED Triage Notes (Signed)
Pt here from home with 2-3cm laceration to left index finger from a portable band saw at home. Reports last tetanus was last year. Bleeding controlled at this time, clean bandage applied in triage.

## 2017-08-22 NOTE — Discharge Instructions (Signed)
You have been treated for a finger laceration. You should keep the finger clean and covered. Follow-up with your provider for suture removal in 10-12 days.

## 2017-08-22 NOTE — ED Provider Notes (Signed)
[facility] Emergency Department Provider Note ____________________________________________  Time seen: 1646  I have reviewed the triage vital signs and the nursing notes.  HISTORY  Chief Complaint  Extremity Laceration  HPI [name] is a 47 y.o. male presents himself to the ED for evaluation of accidental laceration to left index finger.  Patient describes he was moving a portable bandsaw in his garage, when he grabbed assault by the blade.  He describes in doing so, the machine got turned on accidentally as it bumped into another piece of equipment.  He dropped the saw immediately, but not before he sustained a laceration to his left index finger.  He reports normal finger range of motion and denies any other injury at this time.  Reports a current tetanus status.  Past Medical History:  Diagnosis Date  . Anal fissure   . Internal hemorrhoids without mention of complication     Patient Active Problem List   Diagnosis Date Noted  . HEMORRHOIDS, INTERNAL 04/21/2008  . ANAL FISSURE 04/21/2008  . RECTAL BLEEDING 04/21/2008    History reviewed. No pertinent surgical history.  Prior to Admission medications   Medication Sig Start Date End Date Taking? Authorizing Provider  clindamycin (CLEOCIN) 150 MG capsule Take 3 capsules (450 mg total) by mouth 3 (three) times daily. 08/28/12   Muthersbaugh, Dahlia ClientHannah, PA-C  HYDROcodone-acetaminophen (NORCO/VICODIN) 5-325 MG per tablet Take 1 tablet by mouth every 4 (four) hours as needed for pain. 08/28/12   Muthersbaugh, Dahlia ClientHannah, PA-C    Allergies Patient has no known allergies.  Family History  Problem Relation Age of Onset  . Diabetes Mother   . Colon cancer Neg Hx     Social History Social History   Tobacco Use  . Smoking status: Current Every Day Smoker    Packs/day: 1.00  . Tobacco comment: 1 pack a day cigarettes  Substance Use Topics  . Alcohol use: No  . Drug use: No    Review of  Systems  Constitutional: Negative for fever. Cardiovascular: Negative for chest pain. Respiratory: Negative for shortness of breath. Musculoskeletal: Negative for back pain.  Left index finger laceration as above. Skin: Negative for rash. Neurological: Negative for headaches, focal weakness or numbness. ____________________________________________  PHYSICAL EXAM:  VITAL SIGNS: ED Triage Vitals [08/22/17 1625]  Enc Vitals Group     BP (!) 148/83     Pulse Rate 86     Resp 16     Temp 98.3 F (36.8 C)     Temp Source Oral     SpO2 97 %     Weight 180 lb (81.6 kg)     Height 6\' 1"  (1.854 m)     Head Circumference      Peak Flow      Pain Score 9     Pain Loc      Pain Edu?      Excl. in GC?     Constitutional: Alert and oriented. Well appearing and in no distress. Head: Normocephalic and atraumatic. Eyes: Conjunctivae are normal. Normal extraocular movements Cardiovascular: Normal rate, regular rhythm. Normal distal pulses. Respiratory: Normal respiratory effort.  Musculoskeletal: Left hand without obvious deformity or dislocation.  There is a laceration to the left index finger at the PIP to the radial aspect.  Patient has normal composite fist and finger flexion on exam.  Nontender with normal range of motion in all extremities.  Neurologic:  Normal gross sensation. Normal speech and language. No gross focal neurologic  deficits are appreciated. Skin:  Skin is warm, dry and intact. No rash noted. ____________________________________________   RADIOLOGY  Left index finger  ____________________________________________  PROCEDURES  .Marland KitchenLaceration Repair Date/Time: 08/22/2017 6:21 PM Performed by: Lissa Hoard, PA-C Authorized by: Lissa Hoard, PA-C   Consent:    Consent obtained:  Verbal   Consent given by:  Patient   Risks discussed:  Pain and poor wound healing Anesthesia (see MAR for exact dosages):    Anesthesia method:  Nerve block    Block location:  Transthecal block index finger   Block anesthetic:  Lidocaine 1% w/o epi   Block injection procedure:  Anatomic landmarks identified and anatomic landmarks palpated   Block outcome:  Anesthesia achieved Laceration details:    Location:  Finger   Finger location:  L index finger   Length (cm):  2.5 Repair type:    Repair type:  Simple Pre-procedure details:    Preparation:  Patient was prepped and draped in usual sterile fashion Exploration:    Hemostasis achieved with:  Tourniquet (Touri-cot)   Contaminated: no   Treatment:    Area cleansed with:  Betadine and saline   Amount of cleaning:  Standard   Irrigation solution:  Sterile saline   Irrigation method:  Syringe   Visualized foreign bodies/material removed: yes   Skin repair:    Repair method:  Sutures   Suture size:  4-0   Suture material:  Nylon   Suture technique:  Simple interrupted   Number of sutures:  5 Approximation:    Approximation:  Close Post-procedure details:    Dressing:  Non-adherent dressing   Patient tolerance of procedure:  Tolerated well, no immediate complications  ____________________________________________  INITIAL IMPRESSION / ASSESSMENT AND PLAN / ED COURSE  With ED evaluation of an accidental laceration to the left index finger.  Patient presents with a laceration to the radial aspect of the left index finger at the PIP.  The wound is cleansed and repaired with sutures.  X-ray reveals no bony injury but some superficial metallic foreign bodies noted.  Several small pieces were removed and were flushed from the wound.  Patient will be discharged with wound care instructions and will follow-up with this department or local clinic for ongoing symptom management. ____________________________________________  FINAL CLINICAL IMPRESSION(S) / ED DIAGNOSES  Final diagnoses:  Laceration of left index finger without foreign body without damage to nail, initial encounter      Lissa Hoard, PA-C 08/22/17 1824    Arnaldo Natal, MD 08/22/17 [phone]

## 2017-08-22 NOTE — ED Notes (Signed)
Pt ambulatory upon discharge; declined wheel chair. Verbalized understanding of discharge instructions and follow-up care. VSS.

## 2018-09-25 ENCOUNTER — Other Ambulatory Visit: Payer: Self-pay

## 2018-09-25 ENCOUNTER — Ambulatory Visit (HOSPITAL_COMMUNITY)
Admission: EM | Admit: 2018-09-25 | Discharge: 2018-09-25 | Disposition: A | Discharge status: Home / Self Care | Payer: Self-pay | Attending: Urgent Care | Admitting: Urgent Care

## 2018-09-25 ENCOUNTER — Encounter (HOSPITAL_COMMUNITY): Payer: Self-pay

## 2018-09-25 DIAGNOSIS — N50811 Right testicular pain: Secondary | ICD-10-CM | POA: Insufficient documentation

## 2018-09-25 DIAGNOSIS — N451 Epididymitis: Secondary | ICD-10-CM | POA: Insufficient documentation

## 2018-09-25 MED ORDER — NAPROXEN 500 MG PO TABS: 500.0000 mg | ORAL_TABLET | Freq: Two times a day (BID) | ORAL | 0 refills | Status: DC

## 2018-09-25 MED ORDER — LEVOFLOXACIN 500 MG PO TABS: 500.0000 mg | ORAL_TABLET | Freq: Every day | ORAL | 0 refills | Status: DC
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
## 2018-09-25 NOTE — ED Triage Notes (Signed)
Pt states he has groin pain in his testicle the right maybe swollen. This has been going on since 3 days. ( pt will not take pain meds.)

## 2018-09-25 NOTE — Discharge Instructions (Signed)
Naproxen is for pain and inflammation and is not an opiate. Take levofloxacin once daily for 10 days to address your infection.

## 2018-09-26 LAB — URINE CULTURE: Culture: NO GROWTH

## 2018-09-27 LAB — URINE CYTOLOGY ANCILLARY ONLY
Chlamydia: NEGATIVE
Neisseria Gonorrhea: NEGATIVE

## 2019-07-11 IMAGING — DX DG FINGER INDEX 2+V*L*
3 series · 3 of 3 positions shown · non-contrast
Comparison: None.

CLINICAL DATA: Band saw laceration of the left index finger

EXAM:
LEFT INDEX FINGER 2+V

[finger ap]
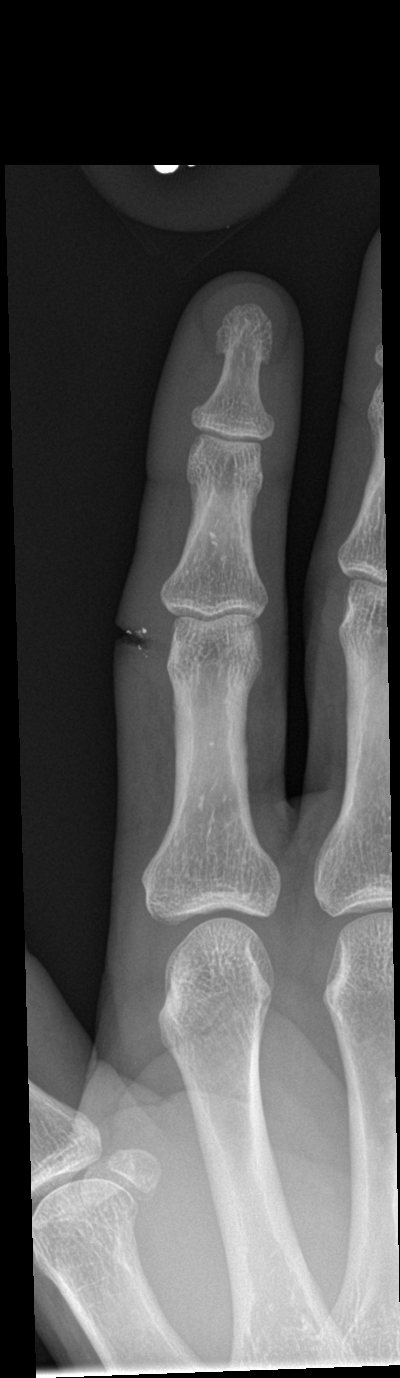

[finger obl]
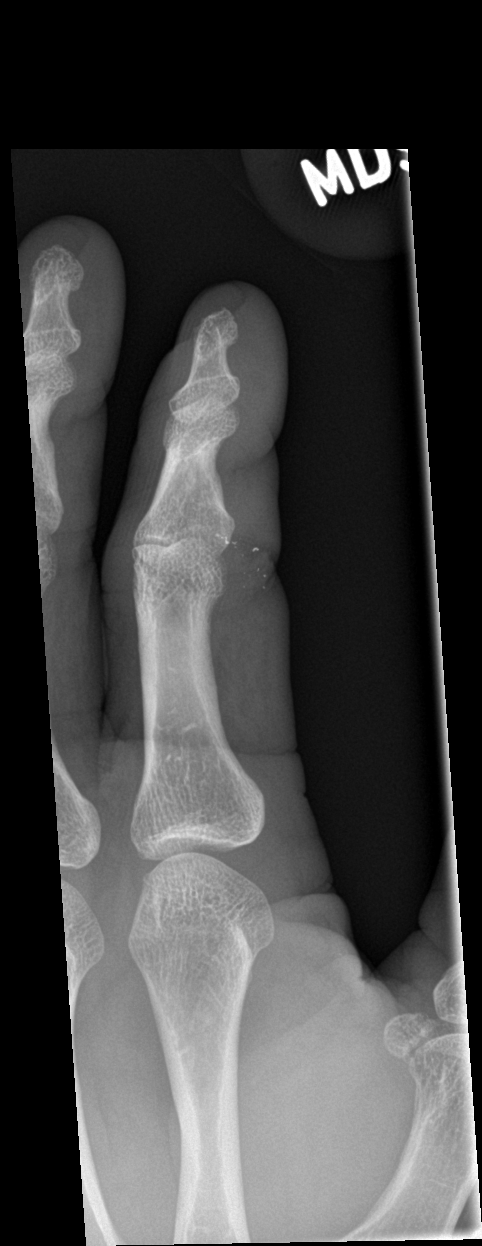

[finger lat]
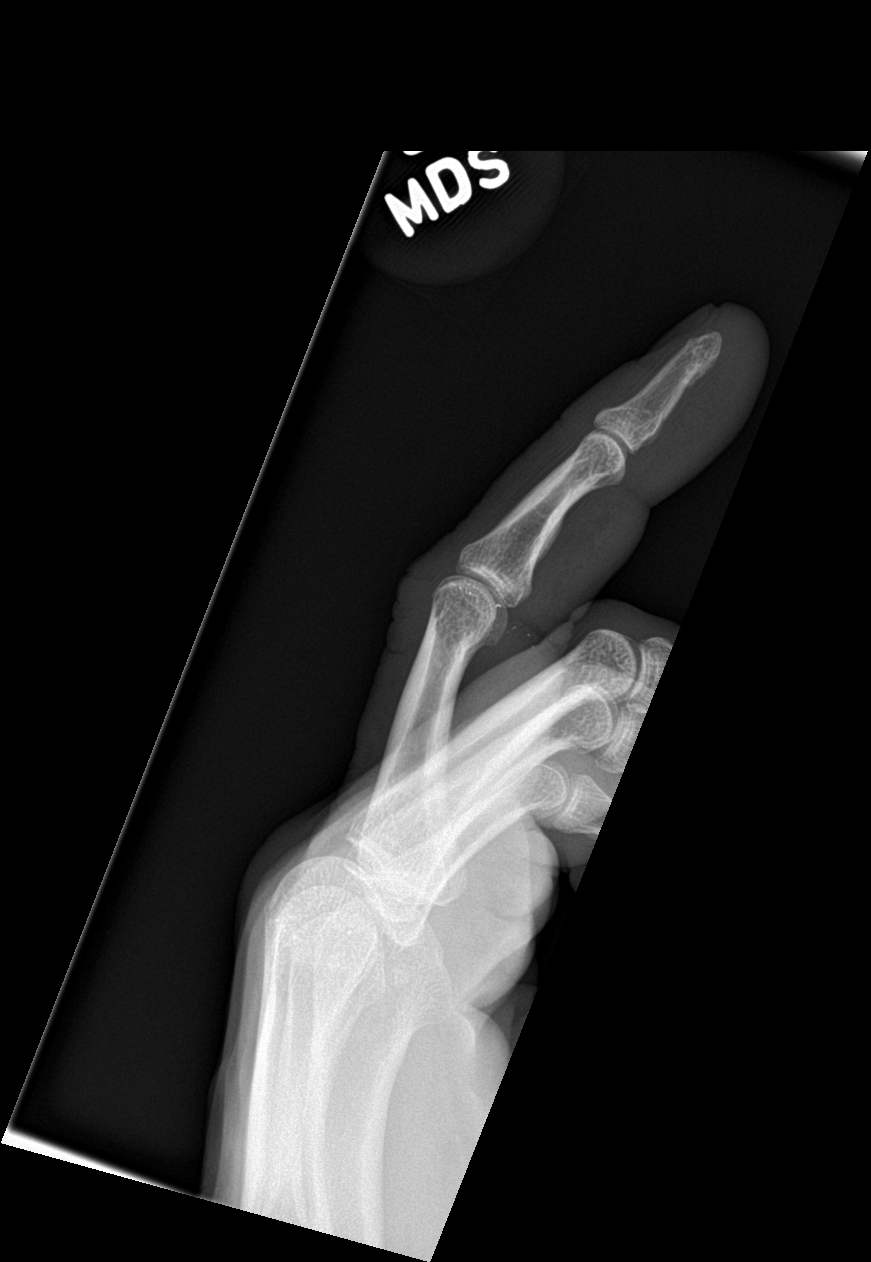

[3 of 3 positions shown; findings below may reference images not displayed]

FINDINGS: Sharply defined laceration just lateral to the head of the proximal
phalanx of the index finger noted, extending about 5 mm into the
tissues but sparing the bone. Small metal filings are present along
the margins the laceration.
IMPRESSION: 1. 5 mm deep laceration of the lateral index finger, with small
metal filngs along the laceration margin but no bony involvement.

## 2021-02-16 ENCOUNTER — Other Ambulatory Visit: Payer: Self-pay

## 2021-02-16 ENCOUNTER — Emergency Department (HOSPITAL_COMMUNITY)
Admission: EM | Admit: 2021-02-16 | Discharge: 2021-02-17 | Disposition: A | Discharge status: Home / Self Care | Payer: Self-pay | Attending: Emergency Medicine | Admitting: Emergency Medicine

## 2021-02-16 ENCOUNTER — Encounter (HOSPITAL_COMMUNITY): Payer: Self-pay | Admitting: Emergency Medicine

## 2021-02-16 DIAGNOSIS — Z28310 Unvaccinated for covid-19: Secondary | ICD-10-CM | POA: Insufficient documentation

## 2021-02-16 DIAGNOSIS — Z5321 Procedure and treatment not carried out due to patient leaving prior to being seen by health care provider: Secondary | ICD-10-CM | POA: Insufficient documentation

## 2021-02-16 DIAGNOSIS — U071 COVID-19: Secondary | ICD-10-CM | POA: Insufficient documentation

## 2021-02-16 LAB — CBC WITH DIFFERENTIAL/PLATELET
Abs Immature Granulocytes: 0 10*3/uL (ref 0.00–0.07)
Band Neutrophils: 14 %
Basophils Absolute: 0 10*3/uL (ref 0.0–0.1)
Basophils Relative: 0 %
Eosinophils Absolute: 0 10*3/uL (ref 0.0–0.5)
Eosinophils Relative: 0 %
HCT: 44 % (ref 39.0–52.0)
Hemoglobin: 15.3 g/dL (ref 13.0–17.0)
Lymphocytes Relative: 5 %
Lymphs Abs: 1.3 10*3/uL (ref 0.7–4.0)
MCH: 28.1 pg (ref 26.0–34.0)
MCHC: 34.8 g/dL (ref 30.0–36.0)
MCV: 80.7 fL (ref 80.0–100.0)
Monocytes Absolute: 1.5 10*3/uL — ABNORMAL HIGH (ref 0.1–1.0)
Monocytes Relative: 6 %
Neutro Abs: 22.3 10*3/uL — ABNORMAL HIGH (ref 1.7–7.7)
Neutrophils Relative %: 75 %
Platelets: 176 10*3/uL (ref 150–400)
RBC: 5.45 MIL/uL (ref 4.22–5.81)
RDW: 12 % (ref 11.5–15.5)
WBC: 25.1 10*3/uL — ABNORMAL HIGH (ref 4.0–10.5)
nRBC: 0 % (ref 0.0–0.2)
nRBC: 0 /100 WBC

## 2021-02-16 LAB — URINALYSIS, ROUTINE W REFLEX MICROSCOPIC
Bilirubin Urine: NEGATIVE
Glucose, UA: NEGATIVE mg/dL
Ketones, ur: NEGATIVE mg/dL
Leukocytes,Ua: NEGATIVE
Nitrite: NEGATIVE
Protein, ur: 100 mg/dL — AB
Specific Gravity, Urine: 1.023 (ref 1.005–1.030)
pH: 5 (ref 5.0–8.0)

## 2021-02-16 LAB — COMPREHENSIVE METABOLIC PANEL
ALT: 25 U/L (ref 0–44)
AST: 20 U/L (ref 15–41)
Albumin: 3.1 g/dL — ABNORMAL LOW (ref 3.5–5.0)
Alkaline Phosphatase: 84 U/L (ref 38–126)
Anion gap: 11 (ref 5–15)
BUN: 17 mg/dL (ref 6–20)
CO2: 26 mmol/L (ref 22–32)
Calcium: 9 mg/dL (ref 8.9–10.3)
Chloride: 92 mmol/L — ABNORMAL LOW (ref 98–111)
Creatinine, Ser: 0.84 mg/dL (ref 0.61–1.24)
GFR, Estimated: >60 mL/min (ref >60)
Glucose, Bld: 150 mg/dL — ABNORMAL HIGH (ref 70–99)
Potassium: 3.3 mmol/L — ABNORMAL LOW (ref 3.5–5.1)
Sodium: 129 mmol/L — ABNORMAL LOW (ref 135–145)
Total Bilirubin: 1.5 mg/dL — ABNORMAL HIGH (ref 0.3–1.2)
Total Protein: 6.7 g/dL (ref 6.5–8.1)

## 2021-02-16 LAB — LIPASE, BLOOD: Lipase: 20 U/L (ref 11–51)

## 2021-02-16 LAB — TROPONIN I (HIGH SENSITIVITY): Troponin I (High Sensitivity): 12 ng/L (ref <18)

## 2021-02-16 NOTE — ED Triage Notes (Signed)
Pt got dx w/ Covid yesterday, symptoms started yesterday. Pt has been shob, N/V w/ blood in vomit, weak, fevers/chills.

## 2021-02-16 NOTE — ED Triage Notes (Signed)
Patient +covid blood in urine, vomiting and nauseae along with sob.

## 2021-02-16 NOTE — ED Provider Triage Note (Signed)
Emergency Medicine Provider Triage Evaluation Note  [name] , a 51 y.o. male  was evaluated in triage.  Pt complains of nausea, vomiting, chest pain, and shortness of breath x1 day.  Patient tested positive for COVID-19 at home last night.  Patient is unvaccinated against COVID-19.  Review of Systems  Positive: N/V, CP, SOB Negative:   Physical Exam  BP (!) 138/99 (BP Location: Left Arm)    Pulse (!) 122    Temp 98 F (36.7 C) (Oral)    Resp (!) 24    SpO2 97%  Gen:   Awake, no distress   Resp:  Normal effort  MSK:   Moves extremities without difficulty  Other:    Medical Decision Making  Medically screening exam initiated at 7:52 PM.  Appropriate orders placed.  Luvenia Redden was informed that the remainder of the evaluation will be completed by another provider, this initial triage assessment does not replace that evaluation, and the importance of remaining in the ED until their evaluation is complete.  No signs of respiratory distress Labs CXR Symptoms likely due to COVID infection   Mannie Stabile, [place] 02/16/21 1953

## 2021-11-21 ENCOUNTER — Emergency Department (HOSPITAL_COMMUNITY)
Admission: EM | Admit: 2021-11-21 | Discharge: 2021-11-22 | Disposition: A | Discharge status: Home / Self Care | Payer: Commercial Managed Care - PPO | Attending: Emergency Medicine | Admitting: Emergency Medicine

## 2021-11-21 DIAGNOSIS — H16133 Photokeratitis, bilateral: Secondary | ICD-10-CM | POA: Insufficient documentation

## 2021-11-21 DIAGNOSIS — H5713 Ocular pain, bilateral: Secondary | ICD-10-CM | POA: Diagnosis present

## 2021-11-22 ENCOUNTER — Other Ambulatory Visit: Payer: Self-pay

## 2021-11-22 ENCOUNTER — Encounter (HOSPITAL_COMMUNITY): Payer: Self-pay | Admitting: Emergency Medicine

## 2021-11-22 MED ORDER — ERYTHROMYCIN 5 MG/GM OP OINT
TOPICAL_OINTMENT | Freq: Four times a day (QID) | OPHTHALMIC | Status: DC
  Filled 2021-11-22: qty 3.5

## 2021-11-22 MED ORDER — KETOROLAC TROMETHAMINE 60 MG/2ML IM SOLN
30.0000 mg | Freq: Once | INTRAMUSCULAR | Status: AC
  Administered 2021-11-22: 30 mg via INTRAMUSCULAR
  Filled 2021-11-22: qty 2

## 2021-11-22 MED ORDER — TETRACAINE HCL 0.5 % OP SOLN
1.0000 [drp] | Freq: Once | OPHTHALMIC | Status: AC
  Administered 2021-11-22: 1 [drp] via OPHTHALMIC
  Filled 2021-11-22: qty 4

## 2021-11-22 MED ORDER — FLUORESCEIN SODIUM 1 MG OP STRP
2.0000 | ORAL_STRIP | Freq: Once | OPHTHALMIC | Status: AC
  Administered 2021-11-22: 2 via OPHTHALMIC
  Filled 2021-11-22: qty 2
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
## 2021-11-22 NOTE — Discharge Instructions (Addendum)
For pain control you may take 1000 mg of Tylenol every 8 hours and/or 600 mg of Ibuprofen every 6 hours as needed.  Ointment: apply strip to both eyes 2-3 times per day.
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
## 2021-11-27 ENCOUNTER — Encounter (HOSPITAL_COMMUNITY): Payer: Self-pay

## 2021-11-27 ENCOUNTER — Emergency Department (HOSPITAL_COMMUNITY)
Admission: EM | Admit: 2021-11-27 | Discharge: 2021-11-27 | Disposition: A | Discharge status: Home / Self Care | Payer: Commercial Managed Care - PPO | Attending: Emergency Medicine | Admitting: Emergency Medicine

## 2021-11-27 ENCOUNTER — Emergency Department (HOSPITAL_COMMUNITY): Payer: Commercial Managed Care - PPO

## 2021-11-27 DIAGNOSIS — B269 Mumps without complication: Secondary | ICD-10-CM | POA: Diagnosis not present

## 2021-11-27 DIAGNOSIS — K1120 Sialoadenitis, unspecified: Secondary | ICD-10-CM

## 2021-11-27 DIAGNOSIS — R590 Localized enlarged lymph nodes: Secondary | ICD-10-CM | POA: Diagnosis not present

## 2021-11-27 DIAGNOSIS — R221 Localized swelling, mass and lump, neck: Secondary | ICD-10-CM | POA: Insufficient documentation

## 2021-11-27 DIAGNOSIS — Z20822 Contact with and (suspected) exposure to covid-19: Secondary | ICD-10-CM | POA: Diagnosis not present

## 2021-11-27 DIAGNOSIS — R0981 Nasal congestion: Secondary | ICD-10-CM | POA: Diagnosis present

## 2021-11-27 LAB — SARS CORONAVIRUS 2 BY RT PCR: SARS Coronavirus 2 by RT PCR: NEGATIVE

## 2021-11-27 MED ORDER — AMOXICILLIN-POT CLAVULANATE 875-125 MG PO TABS: 1.0000 | ORAL_TABLET | Freq: Two times a day (BID) | ORAL | 0 refills | Status: DC

## 2021-11-27 MED ORDER — NAPROXEN 375 MG PO TABS: 375.0000 mg | ORAL_TABLET | Freq: Two times a day (BID) | ORAL | 0 refills | Status: DC

## 2021-11-27 MED ORDER — HYDROCODONE-ACETAMINOPHEN 5-325 MG PO TABS
1.0000 | ORAL_TABLET | Freq: Once | ORAL | Status: AC
  Administered 2021-11-27: 1 via ORAL
  Filled 2021-11-27: qty 1

## 2021-11-27 NOTE — Discharge Instructions (Signed)
Please return to the ED with any new symptoms or concerns Please read attached guides concerning for otitis and salivary gland infections Please begin taking Augmentin twice daily for the next 7 days. Please purchase lemon drops as these will help to express the salivary gland.  Please also gently massage the salivary gland as directed in the attached guides. If symptoms fail to resolve in the next 3 to 4 days, please follow-up with PCP or return to ED for further management

## 2021-11-27 NOTE — ED Provider Notes (Signed)
[facility] DEPT Provider Note   CSN: [id number] Arrival date & time: 11/27/21  0447     History  Chief Complaint  Patient presents with   Nasal Congestion    [name] is a 51 y.o. male with medical history of hemorrhoids.  Patient presents to the ED for evaluation of right-sided neck swelling.  Patient reports that last night around 11 PM he was asleep when he was awoken due to pain.  The patient reports that he noticed that the right side of his neck was swollen.  Patient reports that the pain is worse with movement, relieved with rest.  The patient denies any sore throat, trouble swallowing, drooling, fevers.  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  The patient denies any night sweats or chills, loss of appetite, loss of weight.  The patient denies any pain to the right ear, denies any dental pain.  HPI     Home Medications Prior to Admission medications   Medication Sig Start Date End Date Taking? Authorizing Provider  amoxicillin-clavulanate (AUGMENTIN) 875-125 MG tablet Take 1 tablet by mouth every 12 (twelve) hours. 11/27/21  Yes Azucena Cecil, PA-C  naproxen (NAPROSYN) 375 MG tablet Take 1 tablet (375 mg total) by mouth 2 (two) times daily. 11/27/21  Yes Azucena Cecil, PA-C  Buprenorphine HCl-Naloxone HCl 8-2 MG FILM Place 2 Film under the tongue daily. 11/18/21   [provider]  levofloxacin (LEVAQUIN) 500 MG tablet Take 1 tablet (500 mg total) by mouth daily. Patient not taking: Reported on 11/22/2021 09/25/18   Jaynee Eagles, PA-C      Allergies    Patient has no known allergies.    Review of Systems   Review of Systems  Constitutional:  Negative for chills and fever.  HENT:  Positive for facial swelling. Negative for dental problem, drooling, sore throat and trouble swallowing.   Respiratory:  Negative for shortness of breath.    Cardiovascular:  Negative for chest pain.  Gastrointestinal:  Negative for abdominal pain, nausea and vomiting.  Musculoskeletal:  Positive for neck pain and neck stiffness.  All other systems reviewed and are negative.   Physical Exam Updated Vital Signs BP 127/78 (BP Location: Right Arm)   Pulse 62   Temp (!) 97.5 F (36.4 C) (Oral)   Resp 18   SpO2 96%  Physical Exam Vitals and nursing note reviewed.  Constitutional:      General: He is not in acute distress.    Appearance: Normal appearance. He is not ill-appearing, toxic-appearing or diaphoretic.  HENT:     Head: Normocephalic and atraumatic.     Nose: Nose normal. No congestion.     Mouth/Throat:     Mouth: Mucous membranes are moist.     Pharynx: Oropharynx is clear.  Eyes:     Extraocular Movements: Extraocular movements intact.     Conjunctiva/sclera: Conjunctivae normal.     Pupils: Pupils are equal, round, and reactive to light.  Neck:     Comments: Patient with swelling to right side of neck, no overlying skin change Cardiovascular:     Rate and Rhythm: Normal rate and regular rhythm.  Pulmonary:     Effort: Pulmonary effort is normal.     Breath sounds: Normal breath sounds. No wheezing.  Abdominal:     General: Abdomen is flat. Bowel sounds are normal.     Palpations: Abdomen is soft.  Tenderness: There is no abdominal tenderness.  Musculoskeletal:     Cervical back: Rigidity and tenderness present.  Lymphadenopathy:     Cervical: Cervical adenopathy present.  Skin:    General: Skin is warm and dry.     Capillary Refill: Capillary refill takes less than 2 seconds.  Neurological:     Mental Status: He is alert and oriented to person, place, and time.     ED Results / Procedures / Treatments   Labs (all labs ordered are listed, but only abnormal results are displayed) Labs Reviewed  SARS CORONAVIRUS 2 BY RT PCR    EKG None  Radiology CT Soft Tissue Neck Wo Contrast  Result Date:  11/27/2021 CLINICAL DATA:  51 year old male with soft tissue swelling. Nasal congestion. Pain and swelling at the right side of the neck. EXAM: CT NECK WITHOUT CONTRAST TECHNIQUE: Multidetector CT imaging of the neck was performed following the standard protocol without intravenous contrast. RADIATION DOSE REDUCTION: This exam was performed according to the departmental dose-optimization program which includes automated exposure control, adjustment of the mA and/or kV according to patient size and/or use of iterative reconstruction technique. COMPARISON:  Cervical spine MRI 07/09/2007. FINDINGS: Pharynx and larynx: Noncontrast larynx and pharynx soft tissue contours are within normal limits. Negative left parapharyngeal space. Noncontrast retropharyngeal space appears negative. But there is mild inflammation in the right parapharyngeal space at the level of the parotid gland (series 3, image 42), see additional details below. Salivary glands: Noncontrast sublingual space appears negative. Left submandibular and parotid glands appear normal. Noncontrast right submandibular gland appears symmetric although there are mild surrounding soft tissue inflammatory changes in the right submandibular space. Noncontrast right parotid gland is asymmetrically enlarged and heterogeneous with surrounding soft tissue inflammatory stranding. No sialolithiasis. No enlargement of the right parotid duct. No discrete parotid mass in the absence of contrast. Thyroid: Negative. Lymph nodes: Mild reactive appearing right level 2 and level 3 lymph nodes, with maintained normal fatty hila, measuring up to 10 mm short axis versus 6-7 mm on the left side. Asymmetric thickening of the right platysma with subcutaneous and deep fat edema tracking inferiorly in the right neck. Vascular: Vascular patency is not evaluated in the absence of IV contrast. Limited intracranial: Negative. Visualized orbits: Minimally included. Mastoids and visualized  paranasal sinuses: Visualized paranasal sinuses and mastoids are clear. Small volume retained secretions in the nasal cavity with symmetric appearing nasal mucosal thickening. Septum intact with rightward deviation and spurring. Skeleton: Absent dentition throughout. No acute osseous abnormality identified. Upper chest: Negative. IMPRESSION: 1. Positive for Acute Right Parotitis. No sialolithiasis or obstructing etiology identified, favor infectious etiology. Consider Mumps if unvaccinated. 2. Associated regional inflammation, and mild reactive appearing right level 2/3 lymphadenopathy. 3. Possible rhinitis but visible paranasal sinuses are clear. 4. Otherwise negative noncontrast CT appearance of the Neck. Electronically Signed   By: Genevie Ann M.D.   On: 11/27/2021 11:12    Procedures Procedures   Medications Ordered in ED Medications  HYDROcodone-acetaminophen (NORCO/VICODIN) 5-325 MG per tablet 1 tablet (1 tablet Oral Given 11/27/21 1126)    ED Course/ Medical Decision Making/ A&P                           Medical Decision Making Amount and/or Complexity of Data Reviewed Radiology: ordered.  Risk Prescription drug management.   51 year old male presents to the ED for evaluation.  Please see HPI for further details.  On examination the patient  has soft tissue swelling to the right side of his neck.  The patient uvula is midline, he is not drooling, there is no change in phonation.  The patient is nontoxic in appearance.  Patient afebrile, nontachycardic.  Patient with sounds are clear bilaterally, he is not hypoxic.  Patient abdomen is soft and compressible throughout.  Patient CT imaging shows findings consistent with acute parotitis without stone formation.  The patient states he was vaccinated for mumps as a child.  The patient will be started on 7 days of Augmentin twice daily.  The patient is on buprenorphine so we will provide him with naproxen for pain control.  The patient was given  return precautions and he voiced understanding.  The patient had all his questions answered to his satisfaction.  The patient stable at this time for discharge home.  Final Clinical Impression(s) / ED Diagnoses Final diagnoses:  Parotitis not due to mumps    Rx / DC Orders ED Discharge Orders          Ordered    amoxicillin-clavulanate (AUGMENTIN) 875-125 MG tablet  Every 12 hours        11/27/21 1135    naproxen (NAPROSYN) 375 MG tablet  2 times daily        11/27/21 1135              Azucena Cecil, PA-C 11/27/21 1138    Elgie Congo, MD 11/30/21 [phone]

## 2021-11-27 NOTE — ED Triage Notes (Signed)
Pt reports that he has been having sinus congestion for the past few days and now having pain and swelling to the R side of his neck, no fevers. Denies sore throat, has been coughing up some phlegm as well

## 2021-12-21 ENCOUNTER — Ambulatory Visit (HOSPITAL_COMMUNITY)
Admission: EM | Admit: 2021-12-21 | Discharge: 2021-12-21 | Disposition: A | Discharge status: Home / Self Care | Payer: Commercial Managed Care - PPO | Attending: Sports Medicine | Admitting: Sports Medicine

## 2021-12-21 ENCOUNTER — Encounter (HOSPITAL_COMMUNITY): Payer: Self-pay | Admitting: Emergency Medicine

## 2021-12-21 DIAGNOSIS — J0100 Acute maxillary sinusitis, unspecified: Secondary | ICD-10-CM

## 2021-12-21 MED ORDER — DOXYCYCLINE HYCLATE 100 MG PO CAPS: 100.0000 mg | ORAL_CAPSULE | Freq: Two times a day (BID) | ORAL | 0 refills | Status: AC

## 2021-12-21 NOTE — Discharge Instructions (Addendum)
Take doxycycline twice per day for 7 days Afrin nasal spray 1x per day, use no more than 3 days at a time

## 2021-12-21 NOTE — ED Provider Notes (Signed)
[facility]   [id number] 12/21/21 Arrival Time: 0803  ASSESSMENT & PLAN:  1. Acute non-recurrent maxillary sinusitis    -History and exam consistent with sinusitis.  Will treat with doxycycline given his recent use of Augmentin with no improvement.  Recommended to use over-the-counter Afrin for symptomatic relief, no more than 3 days.  All questions answered and agrees to plan.  Meds ordered this encounter  Medications   doxycycline (VIBRAMYCIN) 100 MG capsule    Sig: Take 1 capsule (100 mg total) by mouth 2 (two) times daily for 7 days.    Dispense:  14 capsule    Refill:  0     Discharge Instructions      Take doxycycline twice per day for 7 days Afrin nasal spray 1x per day, use no more than 3 days at a time        Reviewed expectations re: course of current medical issues. Questions answered. Outlined signs and symptoms indicating need for more acute intervention. Patient verbalized understanding. After Visit Summary given.   SUBJECTIVE: Pleasant 51 year old male comes urgent care to be evaluated for nasal congestion and sinus pressure.  The symptoms of been going on for about 3 weeks.  He reports nasal congestion, worse at night caused him to sleep on his couch because he cannot tolerate laying flat.  He also has pressure in his frontal and maxillary sinuses.  He has tried numerous over-the-counter medications such as Mucinex, Robitussin, Flonase, and Nettie pot.  He felt dizzy after using the Nettie pot last night.  Of note, he did have a recent salivary gland infection was treated with Augmentin about 3 weeks ago.  He says this did not help his sinus symptoms.  Denies any fevers, cough, sore throat, shortness of breath.  No LMP for male patient. History reviewed. No pertinent surgical history.   OBJECTIVE:  Vitals:   12/21/21 0843  BP: (!) 146/90  Pulse: 75  Resp: 18  Temp: 97.7 F (36.5 C)  TempSrc: Oral  SpO2: 96%     Physical  Exam Vitals reviewed.  Constitutional:      General: He is not in acute distress. HENT:     Right Ear: Tympanic membrane is erythematous.     Left Ear: Tympanic membrane, ear canal and external ear normal.     Nose: Congestion present.     Right Turbinates: Enlarged.     Left Turbinates: Enlarged.     Right Sinus: Maxillary sinus tenderness and frontal sinus tenderness present.     Left Sinus: Maxillary sinus tenderness and frontal sinus tenderness present.  Cardiovascular:     Rate and Rhythm: Normal rate.     Heart sounds: Normal heart sounds.  Pulmonary:     Effort: Pulmonary effort is normal.  Musculoskeletal:        General: Normal range of motion.     Cervical back: Normal range of motion and neck supple. No tenderness.  Neurological:     General: No focal deficit present.     Mental Status: He is alert.      Labs: Results for orders placed or performed during the hospital encounter of 11/27/21  SARS Coronavirus 2 by RT PCR (hospital order, performed in [facility] hospital lab) *cepheid single result test* Anterior Nasal Swab   Specimen: Anterior Nasal Swab  Result Value Ref Range   SARS Coronavirus 2 by RT PCR NEGATIVE NEGATIVE   Labs Reviewed - No data to display  Imaging: No results found.  No Known Allergies                                             Past Medical History:  Diagnosis Date   Anal fissure    Internal hemorrhoids without mention of complication     Social History   Socioeconomic History   Marital status: Married    Spouse name: Not on file   Number of children: Not on file   Years of education: Not on file   Highest education level: Not on file  Occupational History   Occupation: Welder  Tobacco Use   Smoking status: Former    Packs/day: 1.00    Types: Cigarettes    Quit date: 2021    Years since quitting: 2.8   Smokeless tobacco: Former   Tobacco comments:    1 pack a day cigarettes  Substance and Sexual Activity   Alcohol  use: No   Drug use: No   Sexual activity: Not on file  Other Topics Concern   Not on file  Social History Narrative   Not on file   Social Determinants of Health   Financial Resource Strain: Not on file  Food Insecurity: Not on file  Transportation Needs: Not on file  Physical Activity: Not on file  Stress: Not on file  Social Connections: Not on file  Intimate Partner Violence: Not on file    Family History  Problem Relation Age of Onset   Diabetes Mother    Colon cancer Neg Hx       Jadakiss Barish, Dorian Pod, MD 12/21/21 [phone]

## 2021-12-21 NOTE — ED Triage Notes (Signed)
Pt reports sinus pressure and nasal congestion x 2 weeks. States he has been having to sleep on his couch for two weeks due to not being able to breathe laying down. Has tried the netti pot and the first time it worked and when he attempted it again he noticed the sinus pressure was worse and now has right ear pain.

## 2021-12-26 ENCOUNTER — Encounter (HOSPITAL_COMMUNITY): Payer: Self-pay | Admitting: Emergency Medicine

## 2021-12-26 ENCOUNTER — Ambulatory Visit (HOSPITAL_COMMUNITY)
Admission: EM | Admit: 2021-12-26 | Discharge: 2021-12-26 | Disposition: A | Discharge status: Home / Self Care | Payer: Commercial Managed Care - PPO | Attending: Emergency Medicine | Admitting: Emergency Medicine

## 2021-12-26 DIAGNOSIS — R0981 Nasal congestion: Secondary | ICD-10-CM

## 2021-12-26 MED ORDER — FLUTICASONE PROPIONATE 50 MCG/ACT NA SUSP: 2.0000 | Freq: Two times a day (BID) | NASAL | 2 refills | Status: DC

## 2021-12-26 NOTE — ED Provider Notes (Signed)
[facility]    CSN: [id number] Arrival date & time: 12/26/21  1642      History   Chief Complaint Chief Complaint  Patient presents with   Nasal Congestion   Oral Pain    HPI [name] is a 51 y.o. male.  Patient presents due to having nasal congestion and sinus pressure for the past 4 weeks.  Patient was seen on 11/27/2021 in the emergency department, he was diagnosed with parotitis and started on Augmentin.  He states that the symptoms from the parotitis has now resolved. Patient was seen at this clinic on 12/21/2021, he was diagnosed with maxillary sinusitis, he began taking doxycycline, he states the last dose is tomorrow and symptoms haven't resolved. He has used saline, affrin, zyrtec, and Flonase with minimal relief of symptoms.   Patient reports a history of nasal bone fracture.  He states that he never received surgery after the fracture.  Patient reports " I have always had issues with my breathing in my nose".  Patient states he has never seen an ENT provider.    Oral Pain Pertinent negatives include no shortness of breath.    Past Medical History:  Diagnosis Date   Anal fissure    Internal hemorrhoids without mention of complication     Patient Active Problem List   Diagnosis Date Noted   HEMORRHOIDS, INTERNAL 04/21/2008   ANAL FISSURE 04/21/2008   RECTAL BLEEDING 04/21/2008    History reviewed. No pertinent surgical history.     Home Medications    Prior to Admission medications   Medication Sig Start Date End Date Taking? Authorizing Provider  amoxicillin-clavulanate (AUGMENTIN) 875-125 MG tablet Take 1 tablet by mouth every 12 (twelve) hours. 11/27/21   Al Decant, PA-C  Buprenorphine HCl-Naloxone HCl 8-2 MG FILM Place 2 Film under the tongue daily. 11/18/21   [provider]  doxycycline (VIBRAMYCIN) 100 MG capsule Take 1 capsule (100 mg total) by mouth 2 (two) times daily for 7 days. 12/21/21 12/28/21  Rafoth,  Baldemar Friday, MD  naproxen (NAPROSYN) 375 MG tablet Take 1 tablet (375 mg total) by mouth 2 (two) times daily. 11/27/21   Al Decant, PA-C    Family History Family History  Problem Relation Age of Onset   Diabetes Mother    Colon cancer Neg Hx     Social History Social History   Tobacco Use   Smoking status: Former    Packs/day: 1.00    Types: Cigarettes    Quit date: 2021    Years since quitting: 2.8   Smokeless tobacco: Former   Tobacco comments:    1 pack a day cigarettes  Substance Use Topics   Alcohol use: No   Drug use: No     Allergies   Patient has no known allergies.   Review of Systems Review of Systems  Constitutional:  Negative for activity change, chills, fatigue and fever.  HENT:  Positive for congestion, mouth sores (one sore area under denture, reported in lower mouth), rhinorrhea and sinus pressure. Negative for ear discharge, ear pain, postnasal drip, sinus pain, sneezing, sore throat, tinnitus and trouble swallowing.   Eyes: Negative.   Respiratory:  Negative for cough, chest tightness and shortness of breath.   Cardiovascular: Negative.      Physical Exam Triage Vital Signs ED Triage Vitals  Enc Vitals Group     BP 12/26/21 1751 (!) 144/107     Pulse Rate 12/26/21 1751 66  Resp 12/26/21 1751 18     Temp 12/26/21 1751 98.4 F (36.9 C)     Temp Source 12/26/21 1751 Oral     SpO2 12/26/21 1751 100 %     Weight --      Height --      Head Circumference --      Peak Flow --      Pain Score 12/26/21 1750 8     Pain Loc --      Pain Edu? --      Excl. in GC? --    No data found.  Updated Vital Signs BP (!) 144/107 (BP Location: Left Arm)   Pulse 66   Temp 98.4 F (36.9 C) (Oral)   Resp 18   SpO2 100%       Physical Exam Vitals and nursing note reviewed.  HENT:     Right Ear: Hearing, tympanic membrane, ear canal and external ear normal.     Left Ear: Hearing, tympanic membrane, ear canal and external ear normal.      Nose: Nasal deformity (Bridge of nose, past nasal fracture reported by patient) and rhinorrhea present. No congestion. Rhinorrhea is clear.     Right Turbinates: Not enlarged, swollen or pale.     Left Turbinates: Not enlarged, swollen or pale.     Right Sinus: No maxillary sinus tenderness or frontal sinus tenderness.     Left Sinus: No maxillary sinus tenderness or frontal sinus tenderness.     Mouth/Throat:     Mouth: Mucous membranes are moist. Oral lesions present.     Dentition: Has dentures.     Tongue: No lesions.     Palate: No mass and lesions.     Pharynx: Oropharynx is clear. Uvula midline. No pharyngeal swelling, oropharyngeal exudate, posterior oropharyngeal erythema or uvula swelling.     Tonsils: No tonsillar exudate or tonsillar abscesses. 0 on the right. 0 on the left.      Comments: 1 mm white lesion with and erythematous base, appears to be a aphthous ulcer      UC Treatments / Results  Labs (all labs ordered are listed, but only abnormal results are displayed) Labs Reviewed - No data to display  EKG   Radiology No results found.  Procedures Procedures (including critical care time)  Medications Ordered in UC Medications - No data to display  Initial Impression / Assessment and Plan / UC Course  I have reviewed the triage vital signs and the nursing notes.  Pertinent labs & imaging results that were available during my care of the patient were reviewed by me and considered in my medical decision making (see chart for details).     Patient was evaluated for ongoing nasal sinus congestion.  Patient was made aware to finish doxycycline prescription.  Flonase was sent to the pharmacy and patient was made aware of medication regiment.  Aphthous ulcer seen on exam but this does not explain symptomology. At this time, based on symptomology and no resolution of symptoms with antimicrobial, antihistamine, and over-the-counter treatments, I advised that this  patient schedule an appointment with the ENT doctor.  Patient was given information for an ENT doctor.  Patient verbalized understanding of instructions.  Final Clinical Impressions(s) / UC Diagnoses   Final diagnoses:  Nasal sinus congestion     Discharge Instructions      Please begin using Flonase, 2 times daily,  place two sprays in each nostril with each administration.   Please call  and schedule an appointment with ENT tomorrow.        ED Prescriptions   None    PDMP not reviewed this encounter.   Debby Freiberg, NP 12/26/21 1911

## 2021-12-26 NOTE — ED Triage Notes (Signed)
Pt reports sore spot in mouth and would like area looked at, could be a possible stone. Pt been on antibiotics for infection of gland and sinuses for week after being seen here last Wed and not helping bc symptoms are worse.

## 2021-12-26 NOTE — Discharge Instructions (Addendum)
Please begin using Flonase, 2 times daily,  place two sprays in each nostril with each administration.   Finish the doxycycline antibiotic that has already been prescribed previously.   Please call and schedule an appointment with ENT tomorrow.

## 2022-03-28 ENCOUNTER — Other Ambulatory Visit: Payer: Self-pay | Admitting: Otolaryngology

## 2022-03-29 ENCOUNTER — Encounter (HOSPITAL_BASED_OUTPATIENT_CLINIC_OR_DEPARTMENT_OTHER): Payer: Self-pay | Admitting: Otolaryngology

## 2022-03-29 ENCOUNTER — Other Ambulatory Visit: Payer: Self-pay

## 2022-04-05 ENCOUNTER — Encounter (HOSPITAL_BASED_OUTPATIENT_CLINIC_OR_DEPARTMENT_OTHER): Payer: Self-pay | Admitting: Otolaryngology

## 2022-04-05 ENCOUNTER — Other Ambulatory Visit: Payer: Self-pay

## 2022-04-05 ENCOUNTER — Ambulatory Visit (HOSPITAL_BASED_OUTPATIENT_CLINIC_OR_DEPARTMENT_OTHER)
Admission: RE | Admit: 2022-04-05 | Discharge: 2022-04-05 | Disposition: A | Discharge status: Home / Self Care | Payer: Commercial Managed Care - PPO | Attending: Otolaryngology | Admitting: Otolaryngology

## 2022-04-05 DIAGNOSIS — Z539 Procedure and treatment not carried out, unspecified reason: Secondary | ICD-10-CM | POA: Diagnosis present

## 2022-04-05 DIAGNOSIS — Z01818 Encounter for other preprocedural examination: Secondary | ICD-10-CM

## 2022-04-05 MED ORDER — OXYMETAZOLINE HCL 0.05 % NA SOLN
NASAL | Status: AC
  Filled 2022-04-05: qty 30

## 2022-04-05 MED ORDER — LACTATED RINGERS IV SOLN: INTRAVENOUS | Status: DC

## 2022-04-05 MED ORDER — LIDOCAINE-EPINEPHRINE 1 %-1:100000 IJ SOLN
INTRAMUSCULAR | Status: AC
  Filled 2022-04-05: qty 1

## 2022-04-05 MED ORDER — EPINEPHRINE HCL (NASAL) 0.1 % NA SOLN
NASAL | Status: AC
  Filled 2022-04-05: qty 10

## 2022-04-05 MED ORDER — BACITRACIN ZINC 500 UNIT/GM EX OINT
TOPICAL_OINTMENT | CUTANEOUS | Status: AC
  Filled 2022-04-05: qty 28.35

## 2022-04-05 NOTE — Progress Notes (Signed)
Pt's surgery canceled today at [facility], Dr. Sabino Gasser had an emergency case at main OR.  Pt aware and will follow up with Dr. Alona Bene office to reschedule.  Pt verbalized understanding.

## 2022-04-13 ENCOUNTER — Other Ambulatory Visit: Payer: Self-pay | Admitting: Otolaryngology

## 2022-04-18 ENCOUNTER — Encounter (HOSPITAL_BASED_OUTPATIENT_CLINIC_OR_DEPARTMENT_OTHER): Payer: Self-pay | Admitting: Otolaryngology

## 2022-04-26 ENCOUNTER — Ambulatory Visit (HOSPITAL_BASED_OUTPATIENT_CLINIC_OR_DEPARTMENT_OTHER)
Admission: RE | Admit: 2022-04-26 | Discharge: 2022-04-26 | Disposition: A | Discharge status: Home / Self Care | Payer: Commercial Managed Care - PPO | Attending: Otolaryngology | Admitting: Otolaryngology

## 2022-04-26 ENCOUNTER — Other Ambulatory Visit: Payer: Self-pay

## 2022-04-26 ENCOUNTER — Ambulatory Visit (HOSPITAL_BASED_OUTPATIENT_CLINIC_OR_DEPARTMENT_OTHER): Payer: Commercial Managed Care - PPO | Admitting: Anesthesiology

## 2022-04-26 ENCOUNTER — Encounter (HOSPITAL_BASED_OUTPATIENT_CLINIC_OR_DEPARTMENT_OTHER)
Admission: RE | Disposition: A | Discharge status: Home / Self Care | Payer: Self-pay | Source: Home / Self Care | Attending: Otolaryngology

## 2022-04-26 ENCOUNTER — Encounter (HOSPITAL_BASED_OUTPATIENT_CLINIC_OR_DEPARTMENT_OTHER): Payer: Self-pay | Admitting: Otolaryngology

## 2022-04-26 DIAGNOSIS — J342 Deviated nasal septum: Secondary | ICD-10-CM | POA: Diagnosis present

## 2022-04-26 DIAGNOSIS — J3489 Other specified disorders of nose and nasal sinuses: Secondary | ICD-10-CM | POA: Diagnosis not present

## 2022-04-26 DIAGNOSIS — Z01818 Encounter for other preprocedural examination: Secondary | ICD-10-CM

## 2022-04-26 DIAGNOSIS — Z87891 Personal history of nicotine dependence: Secondary | ICD-10-CM | POA: Diagnosis not present

## 2022-04-26 HISTORY — PX: NASAL SEPTOPLASTY W/ TURBINOPLASTY: SHX2070

## 2022-04-26 SURGERY — SEPTOPLASTY, NOSE, WITH NASAL TURBINATE REDUCTION
Anesthesia: General | Site: Nose | Laterality: Bilateral

## 2022-04-26 SURGERY — SEPTOPLASTY, NOSE, WITH NASAL TURBINATE REDUCTION
Anesthesia: General | Laterality: Bilateral

## 2022-04-26 MED ORDER — LACTATED RINGERS IV SOLN: INTRAVENOUS | Status: DC

## 2022-04-26 MED ORDER — FLUORESCEIN SODIUM 1 MG OP STRP
ORAL_STRIP | OPHTHALMIC | Status: DC | PRN
  Administered 2022-04-26: 1

## 2022-04-26 MED ORDER — FENTANYL CITRATE (PF) 100 MCG/2ML IJ SOLN
INTRAMUSCULAR | Status: DC | PRN
  Administered 2022-04-26 (×2): 50 ug via INTRAVENOUS
  Administered 2022-04-26: 100 ug via INTRAVENOUS

## 2022-04-26 MED ORDER — MIDAZOLAM HCL 2 MG/2ML IJ SOLN
INTRAMUSCULAR | Status: AC
  Filled 2022-04-26: qty 2

## 2022-04-26 MED ORDER — LIDOCAINE-EPINEPHRINE 1 %-1:100000 IJ SOLN
INTRAMUSCULAR | Status: DC | PRN
  Administered 2022-04-26: 6.5 mL

## 2022-04-26 MED ORDER — SALINE SPRAY 0.65 % NA SOLN: 2.0000 | NASAL | 0 refills | Status: AC

## 2022-04-26 MED ORDER — OXYCODONE HCL 5 MG PO TABS
ORAL_TABLET | ORAL | Status: AC
  Filled 2022-04-26: qty 1

## 2022-04-26 MED ORDER — MIDAZOLAM HCL 2 MG/2ML IJ SOLN
2.0000 mg | Freq: Once | INTRAMUSCULAR | Status: AC
  Administered 2022-04-26: 2 mg via INTRAVENOUS

## 2022-04-26 MED ORDER — OXYCODONE HCL 5 MG PO TABS
5.0000 mg | ORAL_TABLET | Freq: Once | ORAL | Status: AC
  Administered 2022-04-26: 5 mg via ORAL

## 2022-04-26 MED ORDER — IBUPROFEN 200 MG PO TABS: 400.0000 mg | ORAL_TABLET | Freq: Four times a day (QID) | ORAL | 0 refills | Status: AC | PRN

## 2022-04-26 MED ORDER — ONDANSETRON HCL 4 MG/2ML IJ SOLN
INTRAMUSCULAR | Status: AC
  Filled 2022-04-26: qty 2

## 2022-04-26 MED ORDER — LIDOCAINE HCL (CARDIAC) PF 100 MG/5ML IV SOSY
PREFILLED_SYRINGE | INTRAVENOUS | Status: DC | PRN
  Administered 2022-04-26: 100 mg via INTRAVENOUS

## 2022-04-26 MED ORDER — SUGAMMADEX SODIUM 200 MG/2ML IV SOLN
INTRAVENOUS | Status: DC | PRN
  Administered 2022-04-26: 200 mg via INTRAVENOUS

## 2022-04-26 MED ORDER — BACITRACIN ZINC 500 UNIT/GM EX OINT
TOPICAL_OINTMENT | CUTANEOUS | Status: AC
  Filled 2022-04-26: qty 28.35

## 2022-04-26 MED ORDER — FENTANYL CITRATE (PF) 100 MCG/2ML IJ SOLN
25.0000 ug | INTRAMUSCULAR | Status: DC | PRN
  Administered 2022-04-26 (×3): 50 ug via INTRAVENOUS

## 2022-04-26 MED ORDER — PROPOFOL 10 MG/ML IV BOLUS
INTRAVENOUS | Status: AC
  Filled 2022-04-26: qty 20

## 2022-04-26 MED ORDER — DEXAMETHASONE SODIUM PHOSPHATE 10 MG/ML IJ SOLN
INTRAMUSCULAR | Status: AC
  Filled 2022-04-26: qty 1

## 2022-04-26 MED ORDER — ONDANSETRON HCL 4 MG/2ML IJ SOLN
INTRAMUSCULAR | Status: DC | PRN
  Administered 2022-04-26: 4 mg via INTRAVENOUS

## 2022-04-26 MED ORDER — PROPOFOL 10 MG/ML IV BOLUS
INTRAVENOUS | Status: DC | PRN
  Administered 2022-04-26: 150 mg via INTRAVENOUS

## 2022-04-26 MED ORDER — FENTANYL CITRATE (PF) 100 MCG/2ML IJ SOLN
INTRAMUSCULAR | Status: AC
  Filled 2022-04-26: qty 2

## 2022-04-26 MED ORDER — ROCURONIUM BROMIDE 100 MG/10ML IV SOLN
INTRAVENOUS | Status: DC | PRN
  Administered 2022-04-26: 50 mg via INTRAVENOUS

## 2022-04-26 MED ORDER — DEXMEDETOMIDINE HCL IN NACL 80 MCG/20ML IV SOLN
INTRAVENOUS | Status: DC | PRN
  Administered 2022-04-26: 8 ug via BUCCAL
  Administered 2022-04-26: 4 ug via BUCCAL
  Administered 2022-04-26: 8 ug via BUCCAL
  Administered 2022-04-26: 4 ug via BUCCAL

## 2022-04-26 MED ORDER — EPINEPHRINE HCL (NASAL) 0.1 % NA SOLN
NASAL | Status: DC | PRN
  Administered 2022-04-26: 20 mL via TOPICAL

## 2022-04-26 MED ORDER — EPINEPHRINE HCL (NASAL) 0.1 % NA SOLN
NASAL | Status: AC
  Filled 2022-04-26: qty 10

## 2022-04-26 MED ORDER — CEFAZOLIN SODIUM-DEXTROSE 2-3 GM-%(50ML) IV SOLR
INTRAVENOUS | Status: DC | PRN
  Administered 2022-04-26: 2 g via INTRAVENOUS

## 2022-04-26 MED ORDER — ACETAMINOPHEN 500 MG PO TABS: 500.0000 mg | ORAL_TABLET | Freq: Four times a day (QID) | ORAL | 0 refills | Status: AC | PRN

## 2022-04-26 MED ORDER — ACETAMINOPHEN 500 MG PO TABS: 1000.0000 mg | ORAL_TABLET | Freq: Once | ORAL | Status: DC

## 2022-04-26 MED ORDER — DEXAMETHASONE SODIUM PHOSPHATE 4 MG/ML IJ SOLN
INTRAMUSCULAR | Status: DC | PRN
  Administered 2022-04-26: 8 mg via INTRAVENOUS

## 2022-04-26 MED ORDER — MIDAZOLAM HCL 5 MG/5ML IJ SOLN
INTRAMUSCULAR | Status: DC | PRN
  Administered 2022-04-26 (×2): 2 mg via INTRAVENOUS

## 2022-04-26 MED ORDER — FLUORESCEIN SODIUM 1 MG OP STRP
ORAL_STRIP | OPHTHALMIC | Status: AC
  Filled 2022-04-26: qty 1

## 2022-04-26 SURGICAL SUPPLY — 52 items
APL SRG 3 HI ABS STRL LF PLS (MISCELLANEOUS) ×1
APPLICATOR DR MATTHEWS STRL (MISCELLANEOUS) ×2 IMPLANT
BLADE INF TURB ROT M4 2 5PK (BLADE) IMPLANT
BLADE SURG 15 STRL LF DISP TIS (BLADE) IMPLANT
BLADE SURG 15 STRL SS (BLADE)
CANISTER SUCT 1200ML W/VALVE (MISCELLANEOUS) ×2 IMPLANT
COAGULATOR SUCT 8FR VV (MISCELLANEOUS) IMPLANT
COAGULATOR SUCT SWTCH 10FR 6 (ELECTROSURGICAL) IMPLANT
DRSG NASAL KENNEDY LMNT 8CM (GAUZE/BANDAGES/DRESSINGS) IMPLANT
DRSG NASOPORE 8CM (GAUZE/BANDAGES/DRESSINGS) IMPLANT
DRSG TELFA 3X8 NADH STRL (GAUZE/BANDAGES/DRESSINGS) IMPLANT
ELECT NDL BLADE 2-5/6 (NEEDLE) ×2 IMPLANT
ELECT NEEDLE BLADE 2-5/6 (NEEDLE) ×1 IMPLANT
ELECT REM PT RETURN 9FT ADLT (ELECTROSURGICAL) ×1
ELECTRODE REM PT RTRN 9FT ADLT (ELECTROSURGICAL) ×2 IMPLANT
GAUZE SPONGE 2X2 STRL 8-PLY (GAUZE/BANDAGES/DRESSINGS) ×2 IMPLANT
GLOVE BIO SURGEON STRL SZ7.5 (GLOVE) ×2 IMPLANT
GLOVE BIOGEL PI IND STRL 8 (GLOVE) ×2 IMPLANT
GOWN STRL REUS W/ TWL LRG LVL3 (GOWN DISPOSABLE) ×2 IMPLANT
GOWN STRL REUS W/ TWL XL LVL3 (GOWN DISPOSABLE) ×2 IMPLANT
GOWN STRL REUS W/TWL LRG LVL3 (GOWN DISPOSABLE) ×1
GOWN STRL REUS W/TWL XL LVL3 (GOWN DISPOSABLE) ×1
IV SET EXT 30 76VOL 4 MALE LL (IV SETS) IMPLANT
MANIFOLD NEPTUNE II (INSTRUMENTS) ×2 IMPLANT
NDL FILTER BLUNT 18X1 1/2 (NEEDLE) ×2 IMPLANT
NDL HYPO 25GX1X1/2 BEV (NEEDLE) IMPLANT
NDL HYPO 27GX1-1/4 (NEEDLE) ×2 IMPLANT
NEEDLE FILTER BLUNT 18X1 1/2 (NEEDLE) ×1 IMPLANT
NEEDLE HYPO 25GX1X1/2 BEV (NEEDLE) IMPLANT
NEEDLE HYPO 27GX1-1/4 (NEEDLE) ×1 IMPLANT
NS IRRIG 1000ML POUR BTL (IV SOLUTION) ×2 IMPLANT
PACK BASIN DAY SURGERY FS (CUSTOM PROCEDURE TRAY) ×2 IMPLANT
PACK ENT DAY SURGERY (CUSTOM PROCEDURE TRAY) ×2 IMPLANT
PATTIES SURGICAL .5 X3 (DISPOSABLE) ×2 IMPLANT
PENCIL SMOKE EVACUATOR (MISCELLANEOUS) ×2 IMPLANT
SHEATH ENDOSCRUB 0 DEG (SHEATH) ×2 IMPLANT
SLEEVE SCD COMPRESS KNEE MED (STOCKING) ×2 IMPLANT
SOL ANTI FOG 6CC (MISCELLANEOUS) ×2 IMPLANT
SPIKE FLUID TRANSFER (MISCELLANEOUS) IMPLANT
SPLINT NASAL AIRWAY SILICONE (MISCELLANEOUS) ×2 IMPLANT
SPONGE NEURO XRAY DETECT 1X3 (DISPOSABLE) IMPLANT
SPONGE SURGIFOAM ABS GEL 12-7 (HEMOSTASIS) IMPLANT
SUT CHROMIC 4 0 PS 2 18 (SUTURE) ×2 IMPLANT
SUT PLAIN 4 0 ~~LOC~~ 1 (SUTURE) IMPLANT
SUT PLAIN GUT FAST 5-0 (SUTURE) ×2 IMPLANT
SUT SILK 3 0 PS 1 (SUTURE) ×2 IMPLANT
SYR 10ML LL (SYRINGE) ×2 IMPLANT
TOWEL GREEN STERILE FF (TOWEL DISPOSABLE) ×2 IMPLANT
TUBE CONNECTING 20X1/4 (TUBING) ×2 IMPLANT
TUBE SALEM SUMP 12F (TUBING) IMPLANT
TUBE SALEM SUMP 16F (TUBING) IMPLANT
YANKAUER SUCT BULB TIP NO VENT (SUCTIONS) ×2 IMPLANT

## 2022-04-26 NOTE — Discharge Instructions (Addendum)
DISCHARGE INSTRUCTIONS FOLLOWING YOUR SEPTOPLASTY and inferior turbinate reductions  1.  Bloody nasal discharge for several days is common in the post operative period. Please use over the counter saline sprays every 3 hours while you are awake to lubricate your nasal tissues. Please use over the counter antibiotic ointment twice daily until your first post-operative visit.  2.  Use nasal slings to absorb nasal drainage 3.  Swelling and bruising should be minimal 4.  Your nose will be more congested because of blood and mucus until the splints are removed 5.  Take it easy, as reduced energy for several days following surgery is normal 6.  You should be able to return to work in 5 to 7 days 7.  You will have an appointment in 2 days following surgery for removal of your packing (sponges). Dr. Sabino Gasser plans on leaving the silicone splints in for 10-14 days to try to prevent recurrent nasal adhesions.  8.  Your nose is "fragile" for 6 weeks, avoid trauma to the nose during this period.  MEDICATIONS 1. Resume all home medications as directed. 2. Start new discharge medications as directed. 3. Do not drive or operate machinery while taking narcotic pain medications.   DIET 1. Resume same diet prior to your hospital admission.   RETURN 1. Follow-up in the ENT clinic as scheduled 2. Call [phone] to confirm or schedule your appointment.  3. Call [phone] or go to the Emergency Department near you for any symptoms such as fevers with temperature greater than 101.5 F, chills, persistent nausea and vomiting, severe pain not controlled with medications, purulent or foul-smelling drainage from the wound site, or for any acute problems or illness.   Post Anesthesia Home Care Instructions  Activity: Get plenty of rest for the remainder of the day. A responsible individual must stay with you for 24 hours following the procedure.  For the next 24 hours, DO NOT: -Drive a car -Conservation officer, nature -Drink alcoholic beverages -Take any medication unless instructed by your physician -Make any legal decisions or sign important papers.  Meals: Start with liquid foods such as gelatin or soup. Progress to regular foods as tolerated. Avoid greasy, spicy, heavy foods. If nausea and/or vomiting occur, drink only clear liquids until the nausea and/or vomiting subsides. Call your physician if vomiting continues.  Special Instructions/Symptoms: Your throat may feel dry or sore from the anesthesia or the breathing tube placed in your throat during surgery. If this causes discomfort, gargle with warm salt water. The discomfort should disappear within 24 hours.  If you had a scopolamine patch placed behind your ear for the management of post- operative nausea and/or vomiting:  1. The medication in the patch is effective for 72 hours, after which it should be removed.  Wrap patch in a tissue and discard in the trash. Wash hands thoroughly with soap and water. 2. You may remove the patch earlier than 72 hours if you experience unpleasant side effects which may include dry mouth, dizziness or visual disturbances. 3. Avoid touching the patch. Wash your hands with soap and water after contact with the patch.

## 2022-04-26 NOTE — Transfer of Care (Signed)
Immediate Anesthesia Transfer of Care Note  Patient: [name]  Procedure(s) Performed: NASAL SEPTOPLASTY WITH INFERIOR TURBINATE REDUCTION; LYSIS OF ADHESIONS (Bilateral: Nose)  Patient Location: PACU  Anesthesia Type:General  Level of Consciousness: awake, alert , and oriented  Airway & Oxygen Therapy: Patient Spontanous Breathing and Patient connected to face mask oxygen  Post-op Assessment: Report given to RN and Post -op Vital signs reviewed and stable  Post vital signs: Reviewed and stable  Last Vitals:  Vitals Value Taken Time  BP    Temp    Pulse 92 04/26/22 1159  Resp 18 04/26/22 1204  SpO2 96 % 04/26/22 1159  Vitals shown include unvalidated device data.  Last Pain:  Vitals:   04/26/22 0849  TempSrc: Oral  PainSc: 0-No pain      Patients Stated Pain Goal: 6 ([id number])  Complications: No notable events documented.

## 2022-04-26 NOTE — H&P (Signed)
[name] is an 52 y.o. male.    Chief Complaint:  Deviated septum, ITH, nasal adhesions  HPI: Patient presents today for planned elective procedure.  He/she denies any interval change in history since office visit on 02/15/22/  Past Medical History:  Diagnosis Date   Anal fissure    Internal hemorrhoids without mention of complication     Past Surgical History:  Procedure Laterality Date   TONSILLECTOMY      Family History  Problem Relation Age of Onset   Diabetes Mother    Colon cancer Neg Hx     Social History:  reports that he quit smoking about 3 years ago. His smoking use included cigarettes. He smoked an average of 1 pack per day. He has quit using smokeless tobacco. He reports current drug use. Drug: Other-see comments. He reports that he does not drink alcohol.  Allergies: No Known Allergies  Medications Prior to Admission  Medication Sig Dispense Refill   Buprenorphine HCl-Naloxone HCl 8-2 MG FILM Place 2 Film under the tongue daily.      No results found for this or any previous visit (from the past 48 hour(s)). No results found.  ROS: negative other than stated in HPI  Height '6\' 1"'$  (1.854 m), weight 89.5 kg.  PHYSICAL EXAM: General: Resting comfortably in NAD  Lungs: Non-labored respiratinos  Studies Reviewed: none   Assessment/Plan Deviated nasal septum Inferior turbinate hypertrophy Nasal adhesions  Proceed with septoplasty, inferior turbinate reduction, lysis of nasal adhesions. Informed consent obtained. R/B/A discussed.    Electronically signed by:  Jenetta Downer, MD  Staff Physician [facility] - [facility] [place]  04/26/2022, 8:26 AM

## 2022-04-26 NOTE — Anesthesia Procedure Notes (Signed)
Procedure Name: Intubation Date/Time: 04/26/2022 10:22 AM  Performed by: Bufford Spikes, CRNAPre-anesthesia Checklist: Patient identified, Emergency Drugs available, Suction available and Patient being monitored Patient Re-evaluated:Patient Re-evaluated prior to induction Oxygen Delivery Method: Circle system utilized Preoxygenation: Pre-oxygenation with 100% oxygen Induction Type: IV induction Ventilation: Mask ventilation without difficulty Laryngoscope Size: Miller and 2 Grade View: Grade II Tube type: Oral Tube size: 7.0 mm Number of attempts: 1 Airway Equipment and Method: Stylet and Oral airway Placement Confirmation: ETT inserted through vocal cords under direct vision, positive ETCO2 and breath sounds checked- equal and bilateral Secured at: 21 cm Tube secured with: Tape Dental Injury: Teeth and Oropharynx as per pre-operative assessment

## 2022-04-26 NOTE — Anesthesia Preprocedure Evaluation (Addendum)
Anesthesia Evaluation  Patient identified by MRN, date of birth, ID band Patient awake    Reviewed: Allergy & Precautions, NPO status , Patient's Chart, lab work & pertinent test results  Airway Mallampati: III  TM Distance: >3 FB Neck ROM: Full    Dental  (+) Edentulous Lower, Lower Dentures, Upper Dentures, Edentulous Upper, Dental Advisory Given   Pulmonary former smoker   Pulmonary exam normal breath sounds clear to auscultation       Cardiovascular negative cardio ROS Normal cardiovascular exam Rhythm:Regular Rate:Normal     Neuro/Psych negative neurological ROS  negative psych ROS   GI/Hepatic negative GI ROS, Neg liver ROS,,,  Endo/Other  negative endocrine ROS    Renal/GU negative Renal ROS  negative genitourinary   Musculoskeletal negative musculoskeletal ROS (+)    Abdominal   Peds  Hematology negative hematology ROS (+)   Anesthesia Other Findings   Reproductive/Obstetrics                             Anesthesia Physical Anesthesia Plan  ASA: 2  Anesthesia Plan: General   Post-op Pain Management: Tylenol PO (pre-op)*   Induction: Intravenous  PONV Risk Score and Plan: 2 and Midazolam, Dexamethasone and Ondansetron  Airway Management Planned: Oral ETT  Additional Equipment:   Intra-op Plan:   Post-operative Plan: Extubation in OR  Informed Consent: I have reviewed the patients History and Physical, chart, labs and discussed the procedure including the risks, benefits and alternatives for the proposed anesthesia with the patient or authorized representative who has indicated his/her understanding and acceptance.     Dental advisory given  Plan Discussed with: CRNA  Anesthesia Plan Comments:        Anesthesia Quick Evaluation

## 2022-04-26 NOTE — Anesthesia Postprocedure Evaluation (Signed)
Anesthesia Post Note  Patient: [name]  Procedure(s) Performed: NASAL SEPTOPLASTY WITH INFERIOR TURBINATE REDUCTION; LYSIS OF ADHESIONS (Bilateral: Nose)     Patient location during evaluation: PACU Anesthesia Type: General Level of consciousness: awake and alert Pain management: pain level controlled Vital Signs Assessment: post-procedure vital signs reviewed and stable Respiratory status: spontaneous breathing, nonlabored ventilation, respiratory function stable and patient connected to nasal cannula oxygen Cardiovascular status: blood pressure returned to baseline and stable Postop Assessment: no apparent nausea or vomiting Anesthetic complications: no  No notable events documented.  Last Vitals:  Vitals:   04/26/22 1314 04/26/22 1329  BP: (!) 164/85 (!) 154/92  Pulse: (!) 50 (!) 55  Resp: 10 20  Temp:  36.5 C  SpO2: 99% 100%    Last Pain:  Vitals:   04/26/22 1335  TempSrc:   PainSc: 4    Pain Goal: Patients Stated Pain Goal: 2 (04/26/22 1335)                 Karon Cotterill L Kyree Fedorko

## 2022-04-26 NOTE — Op Note (Signed)
OPERATIVE NOTE  [name] Date/Time of Admission: 04/26/2022  8:13 AM  CSN: [id number] Attending Provider: Jenetta Downer, MD Room/Bed: [facility]/NONE DOB: Dec 02, 1970 Age: 52 y.o.   Pre-Op Diagnosis: Nasal septal deviation; Adhesions of nasal septum and turbinates  Post-Op Diagnosis: Nasal septal deviation; Adhesions of nasal septum and turbinates  Procedure:  Septoplasty Bilateral inferior turbinate reduction via outfracture Bilateral nasal lysis of adhesions   Anesthesia: General  Surgeon(s): Pamala Hurry, MD  Staff: Circulator: Anson Crofts, RN Relief Circulator: Merwyn Katos, RN Scrub Person: Buddy Duty A  Implants: * No implants in log *  Specimens: * No specimens in log *  Complications: none  EBL: 200 ML  IVF: Per anesthesia ML  Condition: stable  Operative Findings:  Severe bilateral nasal adhesions between nasal septum, inferior turbinates, and middle turbinates; completely lysed without violation of nasal septal mucosal flaps. Inferior turbinates out-fractured. Nasopharynx normal ; eustachian tube openings patent, fossa of rosenmuller normal. Middle meati without polyps.  Doyle splints and bilateral merocel packings placed due to moderate bleeding from nasal mucosa  Indications for Procedure: [name] is a 52 year old male with history of nasal adhesions, deviated nasal septum, and enlarged turbinates causing severe nasal obstruction recalcitrant to medical management. He presents today for definitive surgery.   Informed consent obtained. R/B/A discussed including risks of pain, bleeding, infection,  failure to relieve symptoms, nasal septal perforation, nasal synechiae, need for further surgery, risks of anesthesia. Despite these risks the patient requested to proceed.    Description of Operation:  The patient was identified in the pre-op area and consent confirmed in the chart. The patient was then brought to the  operative suite, placed in a supine position, and general anesthesia induced. We then performed an operative time out to confirm proper patient and surgery to be performed. After all were in agreement, we began with surgery.  The head of the bed was then turned 90 degrees. 1:1000 epinephrine-soaked pledgets were placed in both nasal cavities. The patient was then prepped and draped in a standard fashion for septoplasty.  After insertion of a nasal speculum, the septum was injected with 1% lidocaine with epinephrine.   Next lysis of adhesions between the nasal septum and inferior turbinate, nasal septum and middle turbinates was performed carefully with endoscopic scissors taking care to preserve the nasal septal mucosal lining. Moderate bleeding ensued requiring multiple epinephrine soaked pledgets to abate. After lysis of adhesions completed to the nasopharynx , bilateral inferior turbinates were reduced by out-fracture technique with the boise elevator. Hemostasis again obtained with epinephrine pledgets.   Next I proceeded with septoplasty.  A hemitransfixion incision was performed along the left side and a subperichondrial flap was elevated. The flap was then elevated on the opposite side and the deviated cartilage was removed leaving a 1.5 centimeter dorsal and anterior/inferior strut. Deviated bony septum was also excised with double action scissors and open Jansen-Middleton forceps. Maxillary spur was removed with 56m osteotome.  A 4-0 chromic running quilting suture was used to oppose both perichondrial flaps and the hemitransfixion incision was closed with interrupted  5-0 fast gut sutures. Doyle splints were placed bilaterally as well as merocel splints along the length of the inferior turbinate. An orogastric tube was used to suction out pharyngoesophageal contents. The patient was extubated in the operating room and taken to the recovery area in stable condition. The patient tolerated the  procedure without difficulty.  Plan: - merocel removal Friday 04/28/22 - Doyle splints remain  10-14 days due to severe adhesions  Pamala Hurry, MD [facility]  04/26/2022

## 2022-04-27 ENCOUNTER — Encounter (HOSPITAL_BASED_OUTPATIENT_CLINIC_OR_DEPARTMENT_OTHER): Payer: Self-pay | Admitting: Otolaryngology

## 2024-03-13 ENCOUNTER — Encounter: Payer: Self-pay | Admitting: Nurse Practitioner

## 2024-03-13 ENCOUNTER — Ambulatory Visit: Admitting: Nurse Practitioner

## 2024-03-13 VITALS — BP 163/96 | HR 95 | Temp 98.7°F | Wt 218.0 lb

## 2024-03-13 DIAGNOSIS — R251 Tremor, unspecified: Secondary | ICD-10-CM | POA: Diagnosis not present

## 2024-03-13 DIAGNOSIS — Z0001 Encounter for general adult medical examination with abnormal findings: Secondary | ICD-10-CM | POA: Diagnosis not present

## 2024-03-13 DIAGNOSIS — E538 Deficiency of other specified B group vitamins: Secondary | ICD-10-CM | POA: Diagnosis not present

## 2024-03-13 DIAGNOSIS — I10 Essential (primary) hypertension: Secondary | ICD-10-CM

## 2024-03-13 DIAGNOSIS — R7303 Prediabetes: Secondary | ICD-10-CM

## 2024-03-13 DIAGNOSIS — E559 Vitamin D deficiency, unspecified: Secondary | ICD-10-CM

## 2024-03-13 DIAGNOSIS — Z0000 Encounter for general adult medical examination without abnormal findings: Secondary | ICD-10-CM

## 2024-03-13 DIAGNOSIS — Z125 Encounter for screening for malignant neoplasm of prostate: Secondary | ICD-10-CM

## 2024-03-13 DIAGNOSIS — Z13220 Encounter for screening for lipoid disorders: Secondary | ICD-10-CM

## 2024-03-13 DIAGNOSIS — Z7689 Persons encountering health services in other specified circumstances: Secondary | ICD-10-CM

## 2024-03-13 DIAGNOSIS — Z1211 Encounter for screening for malignant neoplasm of colon: Secondary | ICD-10-CM

## 2024-03-13 DIAGNOSIS — Z1329 Encounter for screening for other suspected endocrine disorder: Secondary | ICD-10-CM

## 2024-03-13 LAB — POCT GLYCOSYLATED HEMOGLOBIN (HGB A1C): Hemoglobin A1C: 6 % — AB (ref 4.0–5.6)

## 2024-03-13 MED ORDER — AMLODIPINE BESYLATE 2.5 MG PO TABS: 2.5000 mg | ORAL_TABLET | Freq: Every day | ORAL | 0 refills | Status: AC

## 2024-03-13 NOTE — Progress Notes (Signed)
 "  Subjective   Patient ID: [name], male    DOB: 19-Mar-1970, 54 y.o.   MRN: [id number]  Chief Complaint  Patient presents with   New Patient (Initial Visit)    Left leg and arm keeps shaking and keeps awaken at night x 1 year. Thought it was nervous tick but it is not.     Referring provider: No ref. provider found  [name] is a 54 y.o. male with Past Medical History: No date: Anal fissure No date: Internal hemorrhoids without mention of complication    HPI  Patient presents today to establish care.  He states that he has not had a primary care physician in several years.  He is concerned today about tremor to his left arm and leg.  He states that this has been an issue for over a year now.  He states that it is progressively worsening.  Patient does have a shuffling gait when he walks.  He does have significant left-sided tremor.  We discussed that we will place a referral for him to neurology.  A1c in office today was 6.0.  We did discuss diabetes and discussed diet and exercise.  We will check labs for complete physical today.  Patient is overdue for colonoscopy.  We will place referral to GI as well for colon cancer screening. Denies f/c/s, n/v/d, hemoptysis, PND, leg swelling Denies chest pain or edema    Allergies[1]   There is no immunization history on file for this patient.  Tobacco History: Tobacco Use History[2] Counseling given: Not Answered Tobacco comments: 1 pack a day cigarettes   Outpatient Encounter Medications as of 03/13/2024  Medication Sig   amLODipine  (NORVASC ) 2.5 MG tablet Take 1 tablet (2.5 mg total) by mouth daily.   Buprenorphine HCl-Naloxone HCl 8-2 MG FILM Place 2 Film under the tongue daily.   ibuprofen  (ADVIL ) 200 MG tablet Take 2 tablets (400 mg total) by mouth every 6 (six) hours as needed. (Patient not taking: Reported on 03/13/2024)   sodium chloride (OCEAN) 0.65 % SOLN nasal spray Place 2 sprays into both nostrils every 3  (three) hours. (Patient not taking: Reported on 03/13/2024)   No facility-administered encounter medications on file as of 03/13/2024.    Review of Systems  Review of Systems  Constitutional: Negative.   HENT: Negative.    Cardiovascular: Negative.   Gastrointestinal: Negative.   Allergic/Immunologic: Negative.   Neurological:  Positive for tremors.  Psychiatric/Behavioral: Negative.       Objective:   BP (!) 163/96   Pulse 95   Temp 98.7 F (37.1 C) (Temporal)   Wt 218 lb (98.9 kg)   SpO2 97%   BMI 28.76 kg/m   Wt Readings from Last 5 Encounters:  03/13/24 218 lb (98.9 kg)  04/26/22 196 lb 13.9 oz (89.3 kg)  04/05/22 197 lb 5 oz (89.5 kg)  11/21/21 200 lb (90.7 kg)  09/25/18 200 lb (90.7 kg)     Physical Exam Vitals and nursing note reviewed.  Constitutional:      General: He is not in acute distress.    Appearance: He is well-developed.  Cardiovascular:     Rate and Rhythm: Normal rate and regular rhythm.  Pulmonary:     Effort: Pulmonary effort is normal.     Breath sounds: Normal breath sounds.  Skin:    General: Skin is warm and dry.  Neurological:     Mental Status: He is alert and oriented to person, place, and  time.     Gait: Gait abnormal.     Comments: Significant left side tremor noted.        Assessment & Plan:   Encounter to establish care  Tremor -     Ambulatory referral to Neurology  Thyroid disorder screen -     TSH  Lipid screening -     Lipid panel  Routine adult health maintenance -     CBC -     Comprehensive metabolic panel with GFR  Vitamin D deficiency -     VITAMIN D 25 Hydroxy (Vit-D Deficiency, Fractures)  Vitamin B12 deficiency -     Vitamin B12  Prostate cancer screening -     PSA  Colon cancer screening -     Ambulatory referral to Gastroenterology  Prediabetes -     POCT glycosylated hemoglobin (Hb A1C)  Primary hypertension -     amLODIPine  Besylate; Take 1 tablet (2.5 mg total) by mouth daily.   Dispense: 90 tablet; Refill: 0     Return in about 4 weeks (around 04/10/2024) for recheck blood pressure.    Bascom GORMAN Borer, NP 03/13/2024     [1] No Known Allergies [2]  Social History Tobacco Use  Smoking Status Former   Current packs/day: 0.00   Average packs/day: 1.0 packs/day   Types: Cigarettes   Quit date: 2021   Years since quitting: 5.0  Smokeless Tobacco Former  Tobacco Comments   1 pack a day cigarettes   "

## 2024-03-14 ENCOUNTER — Ambulatory Visit: Payer: Self-pay | Admitting: Nurse Practitioner

## 2024-03-14 LAB — LIPID PANEL
Chol/HDL Ratio: 3.5 ratio (ref 0.0–5.0)
Cholesterol, Total: 199 mg/dL (ref 100–199)
HDL: 57 mg/dL
LDL Chol Calc (NIH): 128 mg/dL — ABNORMAL HIGH (ref 0–99)
Triglycerides: 78 mg/dL (ref 0–149)
VLDL Cholesterol Cal: 14 mg/dL (ref 5–40)

## 2024-03-14 LAB — CBC
Hematocrit: 43 % (ref 37.5–51.0)
Hemoglobin: 14.4 g/dL (ref 13.0–17.7)
MCH: 29 pg (ref 26.6–33.0)
MCHC: 33.5 g/dL (ref 31.5–35.7)
MCV: 87 fL (ref 79–97)
Platelets: 288 10*3/uL (ref 150–450)
RBC: 4.97 x10E6/uL (ref 4.14–5.80)
RDW: 12.7 % (ref 11.6–15.4)
WBC: 8.4 10*3/uL (ref 3.4–10.8)

## 2024-03-14 LAB — COMPREHENSIVE METABOLIC PANEL WITH GFR
ALT: 46 [IU]/L — ABNORMAL HIGH (ref 0–44)
AST: 25 [IU]/L (ref 0–40)
Albumin: 4.5 g/dL (ref 3.8–4.9)
Alkaline Phosphatase: 74 [IU]/L (ref 47–123)
BUN/Creatinine Ratio: 16 (ref 9–20)
BUN: 13 mg/dL (ref 6–24)
Bilirubin Total: 0.3 mg/dL (ref 0.0–1.2)
CO2: 24 mmol/L (ref 20–29)
Calcium: 9.5 mg/dL (ref 8.7–10.2)
Chloride: 102 mmol/L (ref 96–106)
Creatinine, Ser: 0.8 mg/dL (ref 0.76–1.27)
Globulin, Total: 1.9 g/dL (ref 1.5–4.5)
Glucose: 95 mg/dL (ref 70–99)
Potassium: 4 mmol/L (ref 3.5–5.2)
Sodium: 139 mmol/L (ref 134–144)
Total Protein: 6.4 g/dL (ref 6.0–8.5)
eGFR: 106 mL/min/{1.73_m2}

## 2024-03-14 LAB — PSA: Prostate Specific Ag, Serum: 1.2 ng/mL (ref 0.0–4.0)

## 2024-03-14 LAB — TSH: TSH: 5.14 u[IU]/mL — ABNORMAL HIGH (ref 0.450–4.500)

## 2024-03-14 LAB — VITAMIN D 25 HYDROXY (VIT D DEFICIENCY, FRACTURES): Vit D, 25-Hydroxy: 20.9 ng/mL — ABNORMAL LOW (ref 30.0–100.0)

## 2024-03-14 LAB — VITAMIN B12: Vitamin B-12: 658 pg/mL (ref 232–1245)

## 2024-03-14 MED ORDER — VITAMIN D (ERGOCALCIFEROL) 1.25 MG (50000 UNIT) PO CAPS: 50000.0000 [IU] | ORAL_CAPSULE | ORAL | 2 refills | Status: AC

## 2024-04-10 ENCOUNTER — Ambulatory Visit: Payer: Self-pay | Admitting: Nurse Practitioner
